# Patient Record
Sex: Male | Born: 1960 | Race: Black or African American | Hispanic: No | Marital: Married | Smoking: Never smoker
Health system: Southern US, Community
[De-identification: ages and names within clinical notes are randomized; demographics above are authoritative.]

## PROBLEM LIST (undated history)

## (undated) DIAGNOSIS — E785 Hyperlipidemia, unspecified: Secondary | ICD-10-CM

## (undated) DIAGNOSIS — I10 Essential (primary) hypertension: Secondary | ICD-10-CM

## (undated) HISTORY — DX: Hyperlipidemia, unspecified: E78.5

---

## 1998-09-10 ENCOUNTER — Encounter: Payer: Self-pay | Admitting: Emergency Medicine

## 1998-09-10 ENCOUNTER — Emergency Department (HOSPITAL_COMMUNITY): Admission: EM | Admit: 1998-09-10 | Discharge: 1998-09-11 | Payer: Self-pay | Admitting: Emergency Medicine

## 1998-10-27 ENCOUNTER — Ambulatory Visit (HOSPITAL_BASED_OUTPATIENT_CLINIC_OR_DEPARTMENT_OTHER): Admission: RE | Admit: 1998-10-27 | Discharge: 1998-10-27 | Payer: Self-pay | Admitting: *Deleted

## 2000-08-05 ENCOUNTER — Emergency Department (HOSPITAL_COMMUNITY): Admission: EM | Admit: 2000-08-05 | Discharge: 2000-08-06 | Payer: Self-pay | Admitting: Emergency Medicine

## 2000-08-06 ENCOUNTER — Encounter: Payer: Self-pay | Admitting: Emergency Medicine

## 2000-10-02 ENCOUNTER — Ambulatory Visit: Admission: RE | Admit: 2000-10-02 | Discharge: 2000-10-02 | Payer: Self-pay | Admitting: Internal Medicine

## 2000-10-02 ENCOUNTER — Encounter: Payer: Self-pay | Admitting: Internal Medicine

## 2003-12-13 ENCOUNTER — Emergency Department (HOSPITAL_COMMUNITY): Admission: EM | Admit: 2003-12-13 | Discharge: 2003-12-13 | Payer: Self-pay | Admitting: Emergency Medicine

## 2009-01-17 ENCOUNTER — Encounter: Admission: RE | Admit: 2009-01-17 | Discharge: 2009-01-17 | Payer: Self-pay | Admitting: Internal Medicine

## 2011-10-21 ENCOUNTER — Encounter (HOSPITAL_COMMUNITY): Payer: Self-pay

## 2011-10-21 ENCOUNTER — Emergency Department (HOSPITAL_COMMUNITY)
Admission: EM | Admit: 2011-10-21 | Discharge: 2011-10-22 | Disposition: A | Discharge status: Home / Self Care | Payer: BC Managed Care – PPO | Attending: Emergency Medicine | Admitting: Emergency Medicine

## 2011-10-21 DIAGNOSIS — M542 Cervicalgia: Secondary | ICD-10-CM | POA: Insufficient documentation

## 2011-10-21 DIAGNOSIS — I10 Essential (primary) hypertension: Secondary | ICD-10-CM | POA: Insufficient documentation

## 2011-10-21 DIAGNOSIS — M25519 Pain in unspecified shoulder: Secondary | ICD-10-CM

## 2011-10-21 HISTORY — DX: Essential (primary) hypertension: I10

## 2011-10-21 NOTE — ED Notes (Signed)
Pt c/o neck pain for one week. Pt seen by pmd for same. Tonight increase in pain

## 2011-10-22 ENCOUNTER — Emergency Department (HOSPITAL_COMMUNITY): Payer: BC Managed Care – PPO

## 2011-10-22 MED ORDER — HYDROCODONE-ACETAMINOPHEN 5-325 MG PO TABS: 1.0000 | ORAL_TABLET | ORAL | Status: AC | PRN

## 2011-10-22 MED ORDER — CYCLOBENZAPRINE HCL 10 MG PO TABS: 10.0000 mg | ORAL_TABLET | Freq: Two times a day (BID) | ORAL | Status: AC | PRN

## 2011-10-22 MED ORDER — OXYCODONE-ACETAMINOPHEN 5-325 MG PO TABS
1.0000 | ORAL_TABLET | Freq: Once | ORAL | Status: AC
  Administered 2011-10-22: 1 via ORAL
  Filled 2011-10-22: qty 1

## 2011-10-22 NOTE — Discharge Instructions (Signed)
Acromioclavicular Injuries  The AC (acromioclavicular) joint is the joint in the shoulder where the collarbone (clavicle) meets the shoulder blade (scapula). The part of the shoulder blade connected to the collarbone is called the acromion. Common problems with and treatments for the AC joint are detailed below.  ARTHRITIS  Arthritis occurs when the joint has been injured and the smooth padding between the joints (cartilage) is lost. This is the wear and tear seen in most joints of the body if they have been overused. This causes the joint to produce pain and swelling which is worse with activity.   AC JOINT SEPARATION  AC joint separation means that the ligaments connecting the acromion of the shoulder blade and collarbone have been damaged, and the two bones no longer line up. AC separations can be anywhere from mild to severe, and are "graded" depending upon which ligaments are torn and how badly they are torn.   Grade I Injury: the least damage is done, and the AC joint still lines up.   Grade II Injury: damage to the ligaments which reinforce the AC joint. In a Grade II injury, these ligaments are stretched but not entirely torn. When stressed, the AC joint becomes painful and unstable.   Grade III Injury: AC and secondary ligaments are completely torn, and the collarbone is no longer attached to the shoulder blade. This results in deformity; a prominence of the end of the clavicle.  AC JOINT FRACTURE  AC joint fracture means that there has been a break in the bones of the AC joint, usually the end of the clavicle.  TREATMENT  TREATMENT OF AC ARTHRITIS   There is currently no way to replace the cartilage damaged by arthritis. The best way to improve the condition is to decrease the activities which aggravate the problem. Application of ice to the joint helps decrease pain and soreness (inflammation). The use of non-steroidal anti-inflammatory medication is helpful.   If less conservative measures do not  work, then cortisone shots (injections) may be used. These are anti-inflammatories; they decrease the soreness in the joint and swelling.   If non-surgical measures fail, surgery may be recommended. The procedure is generally removal of a portion of the end of the clavicle. This is the part of the collarbone closest to your acromion which is stabilized with ligaments to the acromion of the shoulder blade. This surgery may be performed using a tube-like instrument with a light (arthroscope) for looking into a joint. It may also be performed as an open surgery through a small incision by the surgeon. Most patients will have good range of motion within 6 weeks and may return to all activity including sports by 8-12 weeks, barring complications.  TREATMENT OF AN AC SEPARATION   The initial treatment is to decrease pain. This is best accomplished by immobilizing the arm in a sling and placing an ice pack to the shoulder for 20 to 30 minutes every 2 hours as needed. As the pain starts to subside, it is important to begin moving the fingers, wrist, elbow and eventually the shoulder in order to prevent a stiff or "frozen" shoulder. Instruction on when and how much to move the shoulder will be provided by your caregiver. The length of time needed to regain full motion and function depends on the amount or grade of the injury. Recovery from a Grade I AC separation usually takes 10 to 14 days, whereas a Grade III may take 6 to 8 weeks.   Grade   III injuries usually allow return to full activity with few restrictions. Treatment is also based on the activity demands of the injured shoulder. For example, a high level quarterback with an injured throwing arm will receive more aggressive treatment than someone with a desk job who rarely uses his/her arm for strenuous activities. In some cases, a painful lump may persist which could require a later surgery.  Surgery can be very successful, but the benefits must be weighed against the potential risks.  TREATMENT OF AN AC JOINT FRACTURE Fracture treatment depends on the type of fracture. Sometimes a splint or sling may be all that is required. Other times surgery may be required for repair. This is more frequently the case when the ligaments supporting the clavicle are completely torn. Your caregiver will help you with these decisions and together you can decide what will be the best treatment. HOME CARE INSTRUCTIONS   Apply ice to the injury for 15 to 20 minutes each hour while awake for 2 days. Put the ice in a plastic bag and place a towel between the bag of ice and skin.   If a sling has been applied, wear it constantly for as long as directed by your caregiver, even at night. The sling or splint can be removed for bathing or showering or as directed. Be sure to keep the shoulder in the same place as when the sling is on. Do not lift the arm.   If a figure-of-eight splint has been applied it should be tightened gently by another person every day. Tighten it enough to keep the shoulders held back. Allow enough room to place the index finger between the body and strap. Loosen the splint immediately if there is numbness or tingling in the hands.   Take over-the-counter or prescription medicines for pain, discomfort or fever as directed by your caregiver.   If you or your child has received a follow up appointment, it is very important to keep that appointment in order to avoid long term complications, chronic pain or disability.  SEEK MEDICAL CARE IF:   The pain is not relieved with medications.   There is increased swelling or discoloration that continues to get worse rather than better.   You or your child has been unable to follow up as instructed.   There is progressive numbness and tingling in the arm, forearm or hand.  SEEK IMMEDIATE MEDICAL CARE IF:   The arm is numb, cold or pale.    There is increasing pain in the hand, forearm or fingers.  MAKE SURE YOU:   Understand these instructions.   Will watch your condition.   Will get help right away if you are not doing well or get worse.  Document Released: 04/04/2005 Document Revised: 06/14/2011 Document Reviewed: 09/27/2008 [facility] Patient Information 2012 [place], [place].Arthralgia Your caregiver has diagnosed you as suffering from an arthralgia. Arthralgia means there is pain in a joint. This can come from many reasons including:  Bruising the joint which causes soreness (inflammation) in the joint.   Wear and tear on the joints which occur as we grow older (osteoarthritis).   Overusing the joint.   Various forms of arthritis.   Infections of the joint.  Regardless of the cause of pain in your joint, most of these different pains respond to anti-inflammatory drugs and rest. The exception to this is when a joint is infected, and these cases are treated with antibiotics, if it is a bacterial infection. HOME CARE INSTRUCTIONS  Rest the injured area for as long as directed by your caregiver. Then slowly start using the joint as directed by your caregiver and as the pain allows. Crutches as directed may be useful if the ankles, knees or hips are involved. If the knee was splinted or casted, continue use and care as directed. If an stretchy or elastic wrapping bandage has been applied today, it should be removed and re-applied every 3 to 4 hours. It should not be applied tightly, but firmly enough to keep swelling down. Watch toes and feet for swelling, bluish discoloration, coldness, numbness or excessive pain. If any of these problems (symptoms) occur, remove the ace bandage and re-apply more loosely. If these symptoms persist, contact your caregiver or return to this location.   For the first 24 hours, keep the injured extremity elevated on pillows while lying down.   Apply ice for 15 to 20 minutes to the sore joint  every couple hours while awake for the first half day. Then 3 to 4 times per day for the first 48 hours. Put the ice in a plastic bag and place a towel between the bag of ice and your skin.   Wear any splinting, casting, elastic bandage applications, or slings as instructed.   Only take over-the-counter or prescription medicines for pain, discomfort, or fever as directed by your caregiver. Do not use aspirin immediately after the injury unless instructed by your physician. Aspirin can cause increased bleeding and bruising of the tissues.   If you were given crutches, continue to use them as instructed and do not resume weight bearing on the sore joint until instructed.  Persistent pain and inability to use the sore joint as directed for more than 2 to 3 days are warning signs indicating that you should see a caregiver for a follow-up visit as soon as possible. Initially, a hairline fracture (break in bone) may not be evident on X-rays. Persistent pain and swelling indicate that further evaluation, non-weight bearing or use of the joint (use of crutches or slings as instructed), or further X-rays are indicated. X-rays may sometimes not show a small fracture until a week or 10 days later. Make a follow-up appointment with your own caregiver or one to whom we have referred you. A radiologist (specialist in reading X-rays) may read your X-rays. Make sure you know how you are to obtain your X-ray results. Do not assume everything is normal if you do not hear from [place]. SEEK MEDICAL CARE IF: Bruising, swelling, or pain increases. SEEK IMMEDIATE MEDICAL CARE IF:   Your fingers or toes are numb or blue.   The pain is not responding to medications and continues to stay the same or get worse.   The pain in your joint becomes severe.   You develop a fever over 102 F (38.9 C).   It becomes impossible to move or use the joint.  MAKE SURE YOU:   Understand these instructions.   Will watch your condition.     Will get help right away if you are not doing well or get worse.  Document Released: 06/25/2005 Document Revised: 06/14/2011 Document Reviewed: 02/11/2008 [facility] Patient Information 2012 [place], [place].Shoulder Pain The shoulder is a ball and socket joint. The muscles and tendons (rotator cuff) are what keep the shoulder in its joint and stable. This collection of muscles and tendons holds in the head (ball) of the humerus (upper arm bone) in the fossa (cup) of the scapula (shoulder blade). Today no reason  was found for your shoulder pain. Often pain in the shoulder may be treated conservatively with temporary immobilization. For example, holding the shoulder in one place using a sling for rest. Physical therapy may be needed if problems continue. HOME CARE INSTRUCTIONS   Apply ice to the sore area for 15 to 20 minutes, 3 to 4 times per day for the first 2 days. Put the ice in a plastic bag. Place a towel between the bag of ice and your skin.   If you have or were given a shoulder sling and straps, do not remove for as long as directed by your caregiver or until you see a caregiver for a follow-up examination. If you need to remove it to shower or bathe, move your arm as little as possible.   Sleep on several pillows at night to lessen swelling and pain.   Only take over-the-counter or prescription medicines for pain, discomfort, or fever as directed by your caregiver.   Keep any follow-up appointments in order to avoid any type of permanent shoulder disability or chronic pain problems.  SEEK MEDICAL CARE IF:   Pain in your shoulder increases or new pain develops in your arm, hand, or fingers.   Your hand or fingers are colder than your other hand.   You do not obtain pain relief with the medications or your pain becomes worse.  SEEK IMMEDIATE MEDICAL CARE IF:   Your arm, hand, or fingers are numb or tingling.   Your arm, hand, or fingers are swollen, painful, or turn white or blue.    You develop chest pain or shortness of breath.  MAKE SURE YOU:   Understand these instructions.   Will watch your condition.   Will get help right away if you are not doing well or get worse.  Document Released: 04/04/2005 Document Revised: 06/14/2011 Document Reviewed: 06/09/2011 [facility] Patient Information 2012 [place], [place].

## 2011-10-22 NOTE — ED Provider Notes (Signed)
History     CSN: [id number]  Arrival date & time 10/21/11  2244   First MD Initiated Contact with Patient 10/21/11 2343      Chief Complaint  Patient presents with  . Neck Pain    (Consider location/radiation/quality/duration/timing/severity/associated sxs/prior treatment) HPI  Pt presents to the ED with 1 week of left shoulder pain. He points to hi acromioclavicular joint. Th patient has not been seen yet, he says for this reason. He states that he is not having any neck pain or chest pain. Not shortness of breath, weakness, wheezing or recent illness. 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 Pt is alert and oriented, acting appropriately.   Past Medical History  Diagnosis Date  . Hypertension     No past surgical history on file.  History reviewed. No pertinent family history.  History  Substance Use Topics  . Smoking status: Never Smoker   . Smokeless tobacco: Not on file  . Alcohol Use: No      Review of Systems  All other systems reviewed and are negative.    Allergies  Review of patient's allergies indicates no known allergies.  Home Medications   Current Outpatient Rx  Name Route Sig Dispense Refill  . CYCLOBENZAPRINE HCL 10 MG PO TABS Oral Take 1 tablet (10 mg total) by mouth 2 (two) times daily as needed for muscle spasms. 20 tablet 0  . HYDROCODONE-ACETAMINOPHEN 5-325 MG PO TABS Oral Take 1 tablet by mouth every 4 (four) hours as needed for pain. 10 tablet 0    BP 122/75  Pulse 77  Temp(Src) 98.7 F (37.1 C) (Oral)  Resp 18  Ht 5\' 6"  (1.676 m)  Wt 190 lb (86.183 kg)  BMI 30.67 kg/m2  SpO2 98%  Physical Exam  Nursing note and vitals reviewed. Constitutional: He is oriented to person, place, and time. He appears well-developed and well-nourished. No distress.  HENT:  Head: Normocephalic and atraumatic.  Eyes: Pupils are equal, round, and reactive to light.  Neck: Normal range of motion. Neck supple.    Cardiovascular: Normal rate and regular rhythm.   Pulmonary/Chest: Effort normal.  Abdominal: Soft.  Musculoskeletal:       Left shoulder: He exhibits tenderness (pt very tender at [facility] joint), bony tenderness and pain. He exhibits normal range of motion, no swelling, no effusion, no crepitus, no deformity, no laceration, no spasm, normal pulse and normal strength.       Arms: Neurological: He is oriented to person, place, and time.  Skin: Skin is warm and dry.    ED Course  Procedures (including critical care time)  Labs Reviewed - No data to display Dg Shoulder Left  10/22/2011  *RADIOLOGY REPORT*  Clinical Data: Neck and anterior left shoulder pain.  LEFT SHOULDER - 2+ VIEW  Comparison: None.  Findings: There is no evidence of fracture or dislocation.  The left humeral head is seated within the glenoid fossa.  The acromioclavicular joint is unremarkable in appearance.  No significant soft tissue abnormalities are seen.  The left lung appears clear.  IMPRESSION: No evidence of fracture or dislocation.  Original Report Authenticated By: Tonia Ghent, M.D.     1. Shoulder pain       MDM  Pt given pain medication in ED and an Rx for the same. PT given referral to Ortho for further work-up and management He says he can not take time off from work and declines work  note.  Pt has been advised of the symptoms that warrant their return to the ED. Patient has voiced understanding and has agreed to follow-up with the PCP or specialist.         Dorthula Matas, PA 10/22/11 [phone]

## 2011-10-22 NOTE — ED Notes (Signed)
Ambulates out with mother

## 2011-10-22 NOTE — ED Provider Notes (Signed)
Medical screening examination/treatment/procedure(s) were performed by non-physician practitioner and as supervising physician I was immediately available for consultation/collaboration.  Amari Burnsworth M Melida Northington, MD 10/22/11 0712 

## 2012-01-13 ENCOUNTER — Emergency Department (HOSPITAL_COMMUNITY)
Admission: EM | Admit: 2012-01-13 | Discharge: 2012-01-13 | Disposition: A | Discharge status: Home / Self Care | Payer: BC Managed Care – PPO | Attending: Emergency Medicine | Admitting: Emergency Medicine

## 2012-01-13 ENCOUNTER — Encounter (HOSPITAL_COMMUNITY): Payer: Self-pay | Admitting: Nurse Practitioner

## 2012-01-13 DIAGNOSIS — H11419 Vascular abnormalities of conjunctiva, unspecified eye: Secondary | ICD-10-CM | POA: Insufficient documentation

## 2012-01-13 DIAGNOSIS — I10 Essential (primary) hypertension: Secondary | ICD-10-CM | POA: Insufficient documentation

## 2012-01-13 DIAGNOSIS — H109 Unspecified conjunctivitis: Secondary | ICD-10-CM | POA: Insufficient documentation

## 2012-01-13 DIAGNOSIS — H5789 Other specified disorders of eye and adnexa: Secondary | ICD-10-CM | POA: Insufficient documentation

## 2012-01-13 MED ORDER — SULFACETAMIDE SODIUM 10 % OP SOLN: 2.0000 [drp] | OPHTHALMIC | Status: AC

## 2012-01-13 MED ORDER — NAPHAZOLINE-PHENIRAMINE 0.025-0.3 % OP SOLN: 1.0000 [drp] | OPHTHALMIC | Status: AC | PRN

## 2012-01-13 NOTE — ED Provider Notes (Signed)
History   This chart was scribed for Douglas Booze, MD scribed by Magnus Sinning. The patient was seen in room TR02C/TR02C seen at 18:33.    CSN: [id number]  Arrival date & time 01/13/12  1617   None     Chief Complaint  Patient presents with  . Eye Problem    (Consider location/radiation/quality/duration/timing/severity/associated sxs/prior treatment) HPI Douglas Wolf is a 51 y.o. male who presents to the Emergency Department complaining of moderate left eye redness, that he states he noticed this morning when he woke up. Denies exposure to anyone with conjunctivitis, photophobia, eye drainage, watery eyes, visual changes, blurry vision, or irritation. Patient states that he is unsure if he had any foreign bodies in eye and states he does not have any current foreign body sensation in either eye. Patient does not smoke or consumer ETOH.  Past Medical History  Diagnosis Date  . Hypertension     History reviewed. No pertinent past surgical history.  History reviewed. No pertinent family history.  History  Substance Use Topics  . Smoking status: Never Smoker   . Smokeless tobacco: Not on file  . Alcohol Use: No      Review of Systems  Eyes: Positive for redness. Negative for photophobia, pain, discharge, itching and visual disturbance.    Allergies  Review of patient's allergies indicates no known allergies.  Home Medications  No current outpatient prescriptions on file.  BP 121/74  Pulse 84  Temp 98.4 F (36.9 C) (Oral)  Resp 18  SpO2 100%  Physical Exam  Nursing note and vitals reviewed. Constitutional: He is oriented to person, place, and time. He appears well-developed and well-nourished. No distress.  HENT:  Head: Normocephalic and atraumatic.  Eyes: EOM are normal.       Mild conjunctiva injection, left worse than right. No photophobia and anterior chambers are clear.   Neck: Neck supple. No tracheal deviation present.  Cardiovascular: Normal rate.     Pulmonary/Chest: Effort normal. No respiratory distress.  Musculoskeletal: Normal range of motion.  Neurological: He is alert and oriented to person, place, and time.  Skin: Skin is warm and dry.  Psychiatric: He has a normal mood and affect. His behavior is normal.    ED Course  Procedures (including critical care time) DIAGNOSTIC STUDIES: Oxygen Saturation is 100% on room air, normal by my interpretation.    COORDINATION OF CARE:   1. Conjunctivitis of both eyes       MDM  Conjunctivitis without evidence of other eye pathology. He is given a prescription for sulfacetamide solution and advised to followup with the ophthalmologist if not improving in the next several days.  I personally performed the services described in this documentation, which was scribed in my presence. The recorded information has been reviewed and considered.            Douglas Booze, MD 01/17/12 [phone]

## 2012-01-13 NOTE — ED Notes (Signed)
Woke this am with redness to L eye. Denies injury. Denies pain, itching or other complaints

## 2013-05-18 ENCOUNTER — Other Ambulatory Visit: Payer: Self-pay | Admitting: Internal Medicine

## 2013-05-18 ENCOUNTER — Ambulatory Visit
Admission: RE | Admit: 2013-05-18 | Discharge: 2013-05-18 | Disposition: A | Discharge status: Home / Self Care | Payer: BC Managed Care – PPO | Source: Ambulatory Visit | Attending: Internal Medicine | Admitting: Internal Medicine

## 2013-05-18 DIAGNOSIS — R609 Edema, unspecified: Secondary | ICD-10-CM

## 2013-05-18 DIAGNOSIS — R52 Pain, unspecified: Secondary | ICD-10-CM

## 2013-06-01 ENCOUNTER — Encounter: Payer: Self-pay | Admitting: Podiatry

## 2013-06-01 ENCOUNTER — Ambulatory Visit (INDEPENDENT_AMBULATORY_CARE_PROVIDER_SITE_OTHER): Payer: BC Managed Care – PPO | Admitting: Podiatry

## 2013-06-01 ENCOUNTER — Ambulatory Visit (INDEPENDENT_AMBULATORY_CARE_PROVIDER_SITE_OTHER): Payer: BC Managed Care – PPO

## 2013-06-01 VITALS — BP 126/76 | HR 77 | Resp 16

## 2013-06-01 DIAGNOSIS — M79609 Pain in unspecified limb: Secondary | ICD-10-CM

## 2013-06-01 DIAGNOSIS — M79672 Pain in left foot: Secondary | ICD-10-CM

## 2013-06-01 DIAGNOSIS — M722 Plantar fascial fibromatosis: Secondary | ICD-10-CM

## 2013-06-01 MED ORDER — DICLOFENAC SODIUM 75 MG PO TBEC: 75.0000 mg | DELAYED_RELEASE_TABLET | Freq: Two times a day (BID) | ORAL | Status: DC

## 2013-06-01 MED ORDER — TRIAMCINOLONE ACETONIDE 10 MG/ML IJ SUSP
10.0000 mg | Freq: Once | INTRAMUSCULAR | Status: AC
  Administered 2013-06-01: 10 mg

## 2013-06-03 NOTE — Progress Notes (Signed)
Subjective:     Patient ID: Douglas Wolf, male   DOB: Jul 07, 1961, 52 y.o.   MRN: [id number]  HPI patient presents stating my left heel and arch have been bothering me a lot and I'm not sure what might be happening with it. States that his family doctor did an x-ray and did not see anything but he is scared that there might be a broken bone   Review of Systems     Objective:   Physical Exam Neurovascular status found to be intact with no health history changes noted. Muscle strength adequate mild equinus condition was noted flatfoot deformity noted of both feet.   upon evaluation I noted edema in the plantar aspect left heel and arch with an area that is quite inflamed and sore when pressed Assessment:     Acute plantar fasciitis left with inflammation and fluid noted just distal to the insertion into the calcaneus    Plan:     X-ray and H&P reviewed with patient. Injected the plantar fascia 3 mg Kenalog 5 of Xylocaine Marcaine mixture and dispensed fascial brace with instructions on usage and placed on oral anti-inflammatory Voltaren 75 mg twice a day. Reappoint her recheck in one week

## 2013-06-08 ENCOUNTER — Ambulatory Visit (INDEPENDENT_AMBULATORY_CARE_PROVIDER_SITE_OTHER): Payer: BC Managed Care – PPO | Admitting: Podiatry

## 2013-06-08 ENCOUNTER — Encounter: Payer: Self-pay | Admitting: Podiatry

## 2013-06-08 VITALS — BP 132/77 | HR 72 | Resp 12

## 2013-06-08 DIAGNOSIS — M722 Plantar fascial fibromatosis: Secondary | ICD-10-CM

## 2013-06-10 NOTE — Progress Notes (Signed)
Subjective:     Patient ID: Douglas Wolf, male   DOB: August 06, 1960, 52 y.o.   MRN: [id number]  HPI patient states my left heel is feeling much better but my foot is so flat I know I need some kind inserts to hold the as this has been a chronic condition   Review of Systems     Objective:   Physical Exam    neurovascular status intact with collapse medial longitudinal arch noted both feet and also moderate discomfort to pressure medial fascially band distal to the insertion into the calcaneus Assessment:     Plantar fasciitis left still present with structural changes noted    Plan:     I explained physical therapy and supportive shoe gear and continuation of voltaren. I scanned for custom orthotics to reduce stress on the heel and to lift up the medial longitudinal arch

## 2013-07-06 ENCOUNTER — Encounter: Payer: Self-pay | Admitting: Podiatry

## 2013-07-06 ENCOUNTER — Ambulatory Visit (INDEPENDENT_AMBULATORY_CARE_PROVIDER_SITE_OTHER): Payer: BC Managed Care – PPO | Admitting: Podiatry

## 2013-07-06 VITALS — BP 113/64 | HR 92 | Resp 12

## 2013-07-06 DIAGNOSIS — M7750 Other enthesopathy of unspecified foot: Secondary | ICD-10-CM

## 2013-07-06 NOTE — Patient Instructions (Signed)

## 2013-07-06 NOTE — Progress Notes (Signed)
Subjective:     Patient ID: Douglas Wolf, male   DOB: 1960/11/12, 52 y.o.   MRN: [id number]  HPI patient presents to pick up orthotic stating his heel and arch are feeling better at the current time   Review of Systems     Objective:   Physical Exam Neurovascular status unchanged no health history changes noted with diminishment of pain in the plantar fascial band both feet    Assessment:     Plantar fasciitis improving at this time    Plan:     Gave instructions on physical therapy anti-inflammatories and dispensed orthotics with all instructions on usage

## 2013-08-24 ENCOUNTER — Encounter: Payer: Self-pay | Admitting: Podiatry

## 2013-08-24 ENCOUNTER — Ambulatory Visit (INDEPENDENT_AMBULATORY_CARE_PROVIDER_SITE_OTHER): Payer: BC Managed Care – PPO | Admitting: Podiatry

## 2013-08-24 VITALS — BP 120/72 | HR 59 | Resp 16

## 2013-08-24 DIAGNOSIS — M7750 Other enthesopathy of unspecified foot: Secondary | ICD-10-CM

## 2013-08-24 DIAGNOSIS — B351 Tinea unguium: Secondary | ICD-10-CM

## 2013-08-25 NOTE — Progress Notes (Signed)
Subjective:     Patient ID: Douglas Wolf, male   DOB: 12/08/1960, 53 y.o.   MRN: [id number]  HPI patient presents stating his foot is feeling quite a bit better but having some thickness of his nailbeds which can become bothersome   Review of Systems     Objective:   Physical Exam Neurovascular status unchanged with thick nailbeds 1-5 both feet   and improved pain plantar right arch Assessment:     Mycotic nail infection and improved plantar fasciitis    Plan:     Reviewed condition and discussed trimming to without at this time consideration for oral medications due to the type of fungus patient has reappoint if it gets worse

## 2015-03-03 ENCOUNTER — Encounter: Payer: Self-pay | Admitting: Podiatry

## 2015-03-03 ENCOUNTER — Ambulatory Visit (INDEPENDENT_AMBULATORY_CARE_PROVIDER_SITE_OTHER): Payer: Managed Care, Other (non HMO) | Admitting: Podiatry

## 2015-03-03 ENCOUNTER — Ambulatory Visit (INDEPENDENT_AMBULATORY_CARE_PROVIDER_SITE_OTHER): Payer: Managed Care, Other (non HMO)

## 2015-03-03 VITALS — BP 117/71 | HR 71 | Resp 16

## 2015-03-03 DIAGNOSIS — M779 Enthesopathy, unspecified: Secondary | ICD-10-CM | POA: Diagnosis not present

## 2015-03-03 DIAGNOSIS — B351 Tinea unguium: Secondary | ICD-10-CM | POA: Diagnosis not present

## 2015-03-03 DIAGNOSIS — M79673 Pain in unspecified foot: Secondary | ICD-10-CM | POA: Diagnosis not present

## 2015-03-04 NOTE — Progress Notes (Signed)
He presents today for a follow-up routine nail debridement. He states that his toenails are long and painful. He also states that he has pain to the dorsal lateral aspect of his left foot.   Objective: vital signs are stable alert and oriented 3. Pulses are strongly palpable. Nails are thick yellow dystrophic onychomycotic. He has pain on palpation dorsal aspect left foot. Lisfranc's joints.  Assessment: Capsulitis dorsal aspect left foot.  Pain in limb secondary to onychomycosis.  Plan: injected the area today with dexamethasone local and aesthetic debrided nails 1 through 5 and 6 through 10.   Arbutus Ped DPM

## 2015-03-21 ENCOUNTER — Ambulatory Visit (INDEPENDENT_AMBULATORY_CARE_PROVIDER_SITE_OTHER): Payer: Managed Care, Other (non HMO) | Admitting: Podiatry

## 2015-03-21 ENCOUNTER — Encounter: Payer: Self-pay | Admitting: Podiatry

## 2015-03-21 VITALS — BP 113/63 | HR 60 | Resp 16

## 2015-03-21 DIAGNOSIS — M7750 Other enthesopathy of unspecified foot: Secondary | ICD-10-CM

## 2015-03-21 DIAGNOSIS — M6588 Other synovitis and tenosynovitis, other site: Secondary | ICD-10-CM

## 2015-03-21 DIAGNOSIS — M79672 Pain in left foot: Secondary | ICD-10-CM | POA: Diagnosis not present

## 2015-03-21 MED ORDER — FENOPROFEN CALCIUM 200 MG PO CAPS: 200.0000 mg | ORAL_CAPSULE | Freq: Three times a day (TID) | ORAL | Status: DC

## 2015-03-21 MED ORDER — TRIAMCINOLONE ACETONIDE 10 MG/ML IJ SUSP
10.0000 mg | Freq: Once | INTRAMUSCULAR | Status: AC
  Administered 2015-03-21: 10 mg

## 2015-03-21 NOTE — Progress Notes (Signed)
Subjective:     Patient ID: Douglas Wolf, male   DOB: 1961-04-09, 54 y.o.   MRN: [id number]  HPI patient states he is still experiencing pain in the dorsum of the left foot between the anterior tibial tendon and extensor hallucis longus tendon. States that his foot is flat and that when he stands a lot it gets quite sore   Review of Systems     Objective:   Physical Exam Neurovascular status intact muscle strength adequate with continued discomfort in the dorsum of the left foot around the extensor tendon complex and anterior tibial tendon    Assessment:     Dorsal tendinitis left with inflammation    Plan:     Advised on physical therapy and reinjected the area carefully with 3 mg Kenalog 5 mg Xylocaine and applied fascial brace to lift the arch up. Reappoint for [place] to recheck again and may require orthotics

## 2015-03-22 ENCOUNTER — Telehealth: Payer: Self-pay | Admitting: *Deleted

## 2015-03-22 NOTE — Telephone Encounter (Addendum)
Received fax from [facility] [address] stating Nalfon  is not longer available.  Unable to leave a message, the phone line clicked and no one answered.  Dr. Charlsie Merles ordered Nalfon  #80 one capsule bid 0 refills, sent to [facility].  Ordered and contacted pt's home phone and informed pt' mtr.

## 2015-03-23 MED ORDER — FENOPROFEN CALCIUM 400 MG PO CAPS: 1.0000 | ORAL_CAPSULE | Freq: Two times a day (BID) | ORAL | Status: AC

## 2015-04-18 ENCOUNTER — Ambulatory Visit: Payer: Managed Care, Other (non HMO) | Admitting: Podiatry

## 2015-04-24 IMAGING — CR DG FOOT COMPLETE 3+V*L*
3 series · 3 of 3 positions shown · non-contrast
Comparison: None.

CLINICAL DATA: Pain and swelling

EXAM:
LEFT FOOT - COMPLETE 3+ VIEW

[view not recorded (1 of 3)]
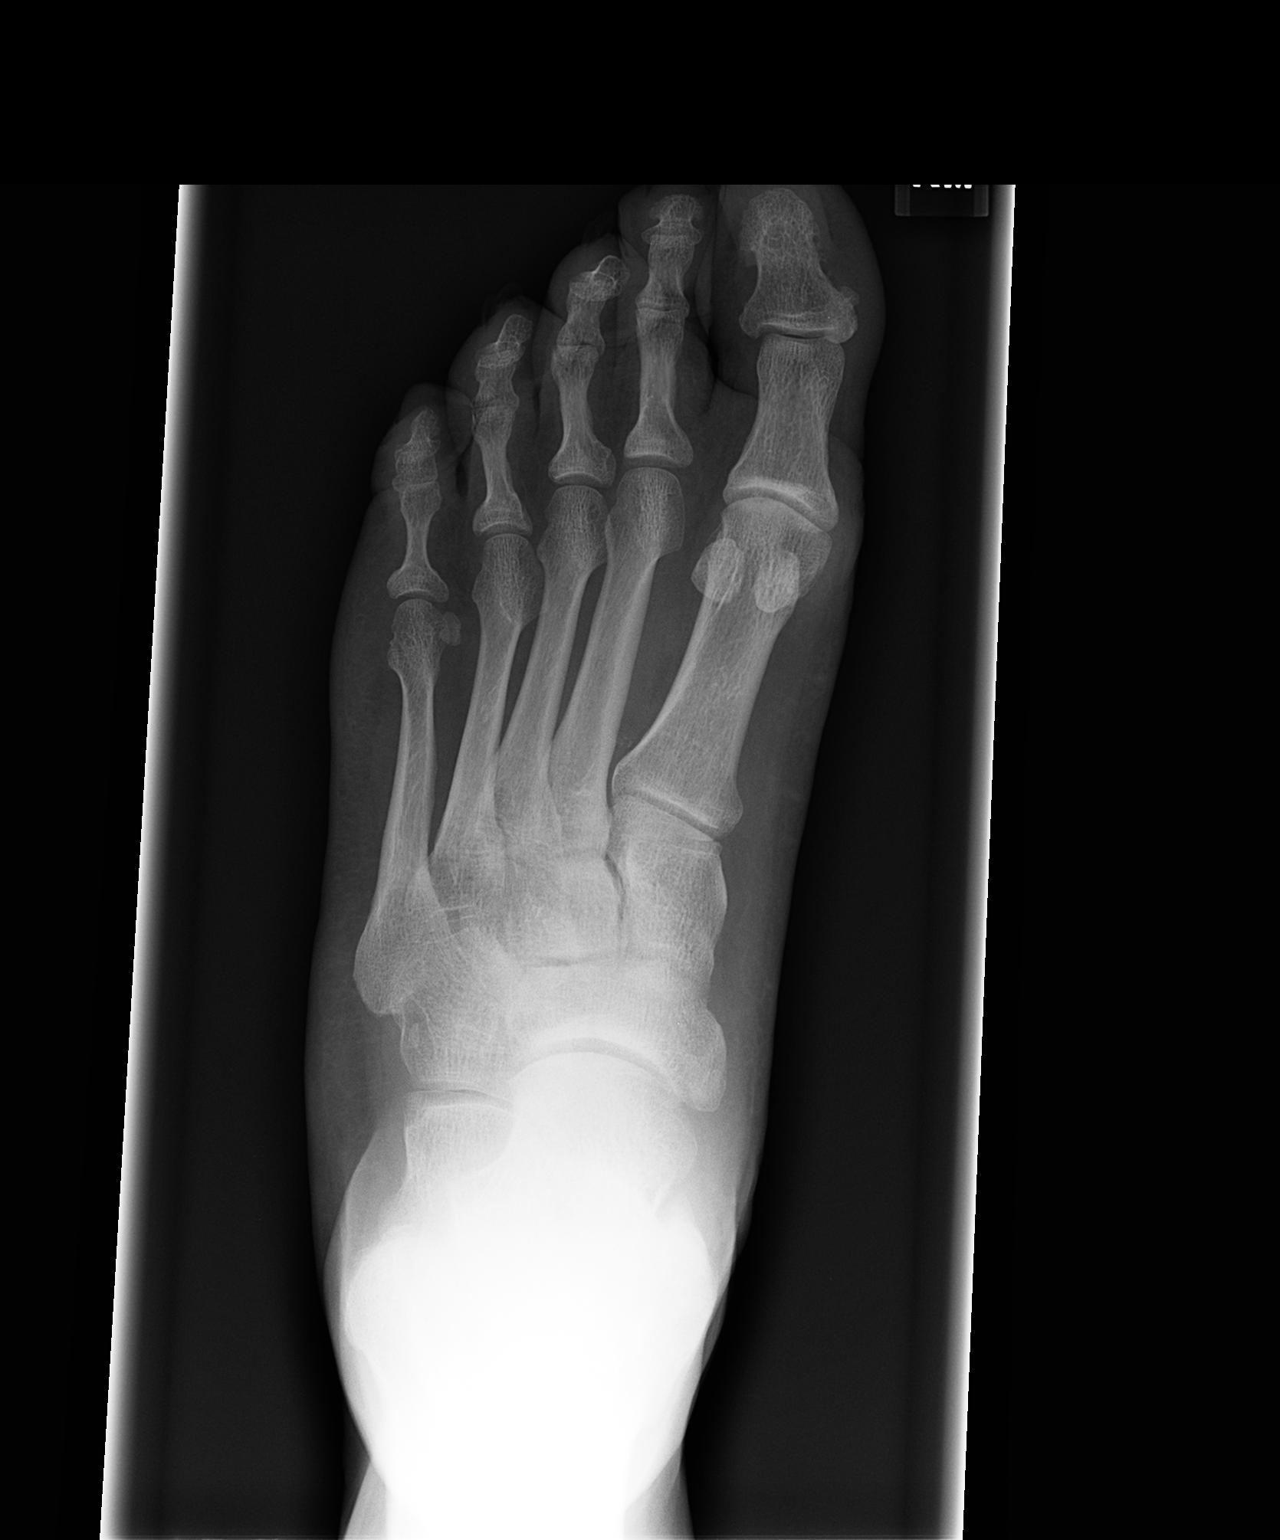

[view not recorded (2 of 3)]
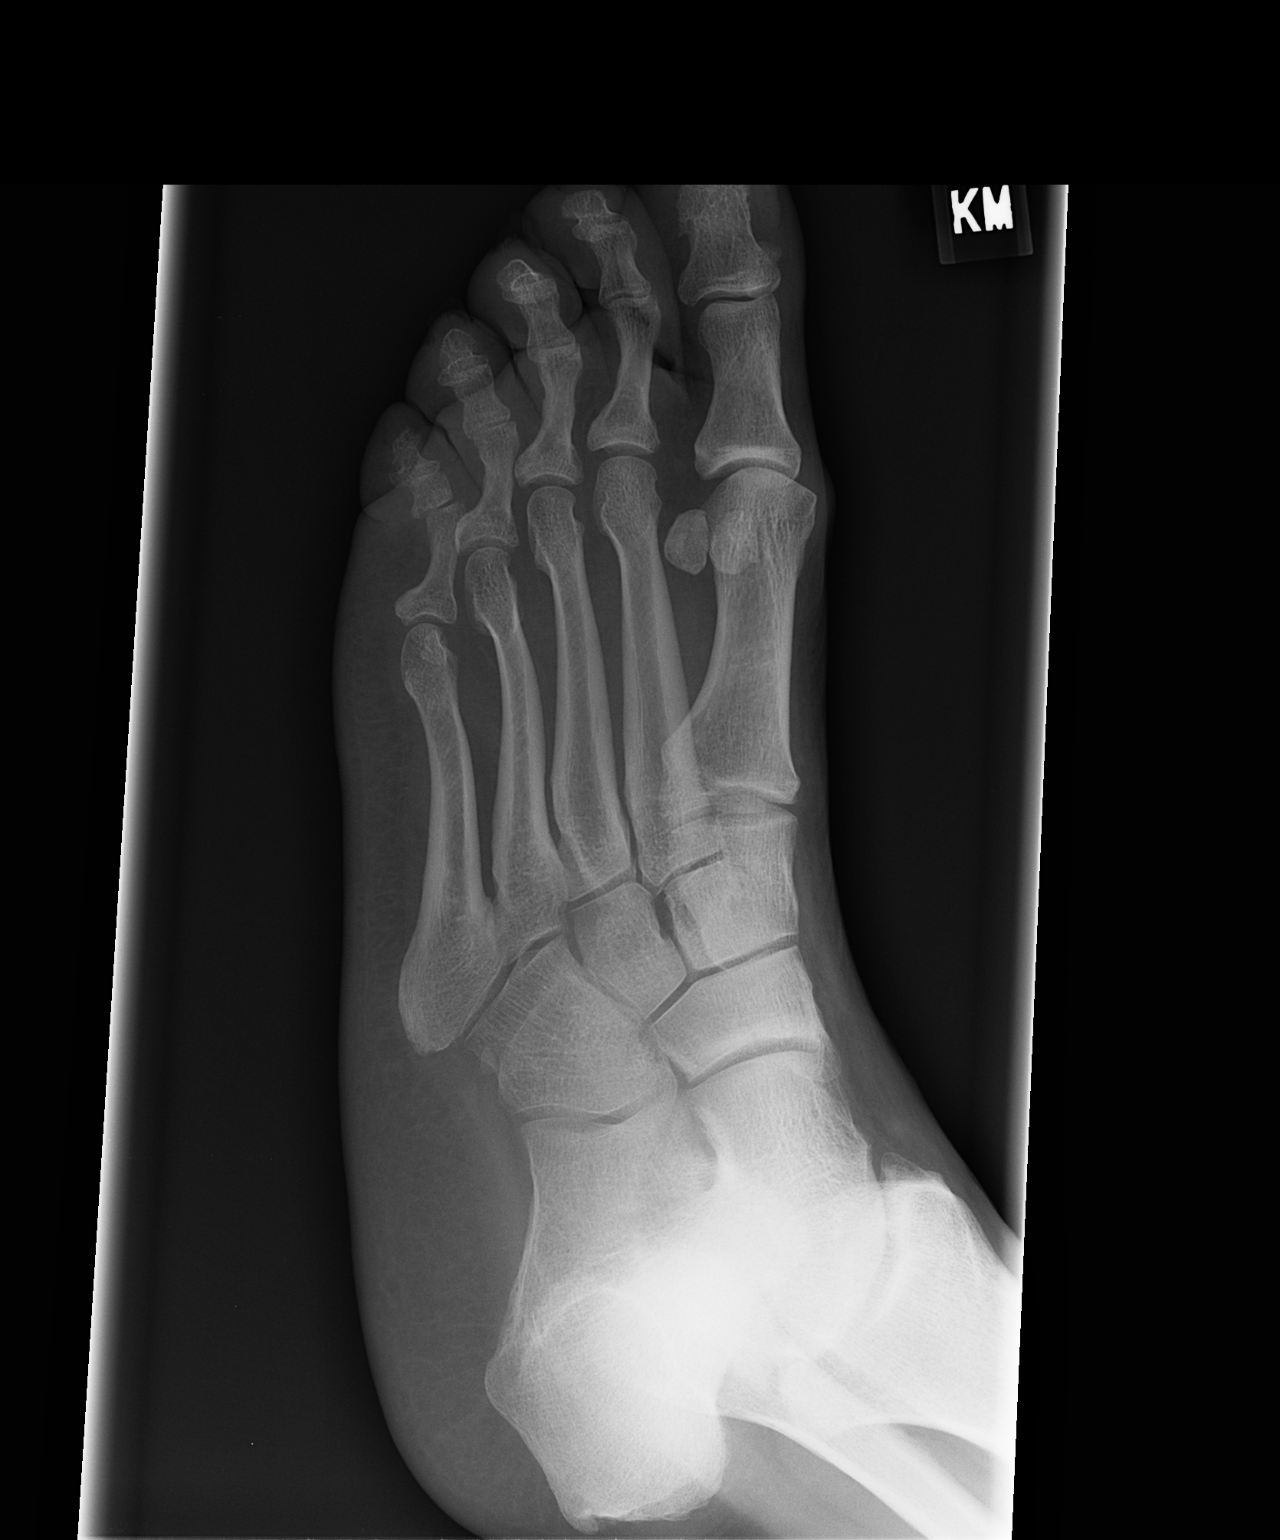

[view not recorded (3 of 3)]
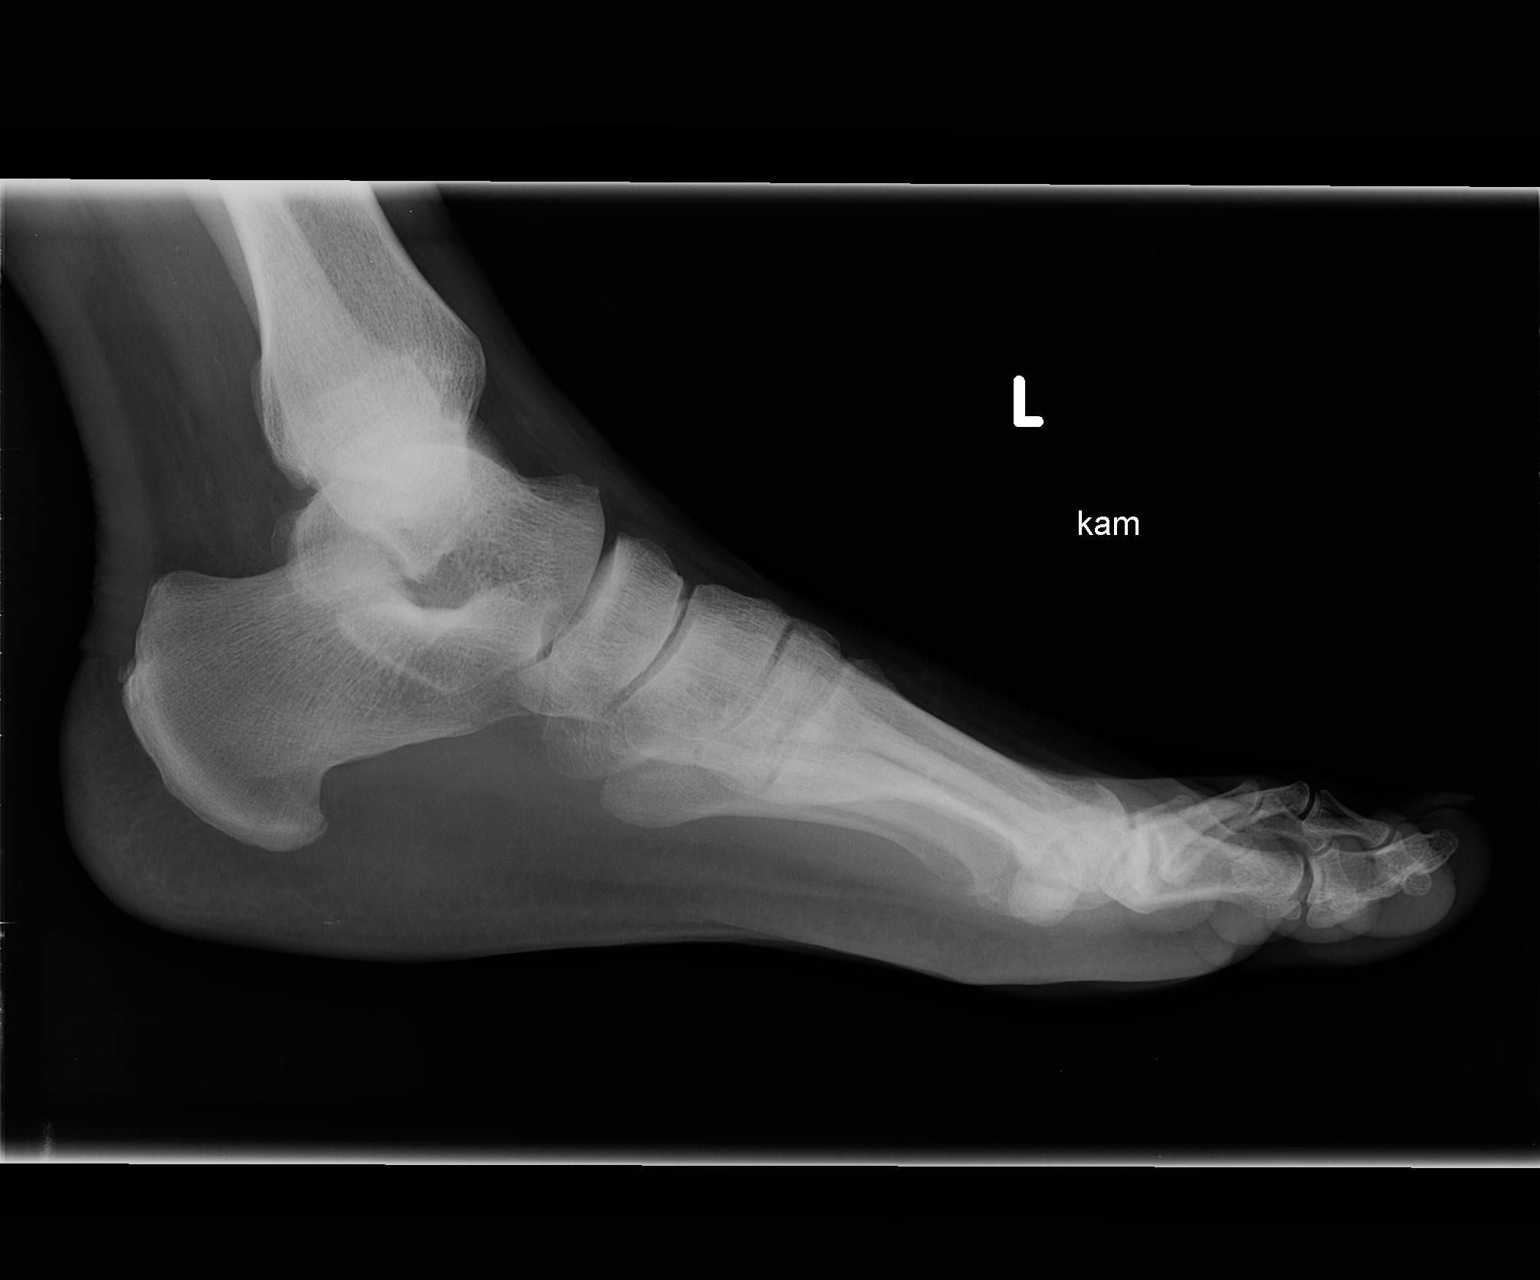

[3 of 3 positions shown; findings below may reference images not displayed]

FINDINGS: There is no evidence of fracture or dislocation. There is no
evidence of arthropathy or other focal bone abnormality. Soft
tissues are unremarkable.
IMPRESSION: Negative.

## 2015-09-25 ENCOUNTER — Encounter (HOSPITAL_COMMUNITY): Payer: Self-pay | Admitting: Emergency Medicine

## 2015-09-25 ENCOUNTER — Emergency Department (HOSPITAL_COMMUNITY)
Admission: EM | Admit: 2015-09-25 | Discharge: 2015-09-25 | Disposition: A | Discharge status: Home / Self Care | Payer: Managed Care, Other (non HMO) | Source: Home / Self Care | Attending: Emergency Medicine | Admitting: Emergency Medicine

## 2015-09-25 DIAGNOSIS — M722 Plantar fascial fibromatosis: Secondary | ICD-10-CM

## 2015-09-25 MED ORDER — DICLOFENAC SODIUM 50 MG PO TBEC: 50.0000 mg | DELAYED_RELEASE_TABLET | Freq: Three times a day (TID) | ORAL | Status: AC

## 2015-09-25 NOTE — ED Notes (Signed)
C/o intermittent right foot/heel pain onset x3 months... Pain increases w/activity... Denies inj/trauma Steady gait... A&O x4... No acute distress.

## 2015-09-25 NOTE — ED Provider Notes (Signed)
CSN: [id number]     Arrival date & time 09/25/15  1353 History   First MD Initiated Contact with Patient 09/25/15 1515     Chief Complaint  Patient presents with  . Foot Pain   (Consider location/radiation/quality/duration/timing/severity/associated sxs/prior Treatment) HPI  He is a 55 year old man here for evaluation of right foot pain.  Pain is located on the plantar aspect of his foot over the heel. It is worse with walking, particularly first thing in the morning. He denies any injury or trauma. He states this is been going on for about 3 weeks. He says it feels similar to when he had plantar fasciitis in his left foot.  Past Medical History  Diagnosis Date  . Hypertension   . Hyperlipidemia    History reviewed. No pertinent past surgical history. No family history on file. Social History  Substance Use Topics  . Smoking status: Never Smoker   . Smokeless tobacco: None  . Alcohol Use: No    Review of Systems As in history of present illness Allergies  Review of patient's allergies indicates no known allergies.  Home Medications   Prior to Admission medications   Medication Sig Start Date End Date Taking? Authorizing Provider  amLODipine-benazepril (LOTREL) 10-20 MG per capsule Take 1 capsule by mouth daily.   Yes Historical Provider, MD  hydrochlorothiazide (HYDRODIURIL) 25 MG tablet Take 25 mg by mouth daily.   Yes Historical Provider, MD  metoprolol tartrate (LOPRESSOR) 25 MG tablet Take 25 mg by mouth 2 (two) times daily.   Yes Historical Provider, MD  rosuvastatin (CRESTOR) 40 MG tablet Take 40 mg by mouth daily.   Yes Historical Provider, MD  diclofenac (VOLTAREN) 50 MG EC tablet Take 1 tablet (50 mg total) by mouth 3 (three) times daily. For 5 days, then as needed for pain 09/25/15   Charm RingsErin J Kelon Easom, MD  Fenoprofen Calcium 400 MG CAPS Take 1 capsule (400 mg total) by mouth 2 (two) times daily. 03/23/15   Lenn SinkNorman S Regal, DPM   Meds Ordered and Administered this Visit   Medications - No data to display  BP 118/68 mmHg  Pulse 63  Temp(Src) 98 F (36.7 C) (Oral)  Resp 16  SpO2 99% No data found.   Physical Exam  Constitutional: He is oriented to person, place, and time. He appears well-developed and well-nourished. No distress.  Cardiovascular: Normal rate.   Pulmonary/Chest: Effort normal.  Musculoskeletal:  Right foot: No erythema or edema. 2+ DP pulse. He is tender at the site of the plantar fascia insertion. 5 out of 5 strength in plantar flexion and dorsiflexion.  Neurological: He is alert and oriented to person, place, and time.    ED Course  Procedures (including critical care time)  Labs Review Labs Reviewed - No data to display  Imaging Review No results found.    MDM   1. Plantar fasciitis of right foot    We'll treat with ice massage, diclofenac, and heel cup. If this is not improving in the next 2 weeks, he will follow-up with his foot doctor.    Charm RingsErin J Aalina Brege, MD 09/25/15 1534

## 2015-09-25 NOTE — Discharge Instructions (Signed)
You have plantar fasciitis in your foot. Freeze a bottle of water or a golf ball. Massage your foot with this at least 3 times a day. Take diclofenac 3 times a day for the next 5 days, then as needed for pain. Get a heel cup at the store to wear in your shoe. Improving in the next 2 weeks, please follow-up with your foot doctor.

## 2015-10-10 ENCOUNTER — Ambulatory Visit (INDEPENDENT_AMBULATORY_CARE_PROVIDER_SITE_OTHER): Payer: Managed Care, Other (non HMO)

## 2015-10-10 ENCOUNTER — Ambulatory Visit (INDEPENDENT_AMBULATORY_CARE_PROVIDER_SITE_OTHER): Payer: Managed Care, Other (non HMO) | Admitting: Podiatry

## 2015-10-10 ENCOUNTER — Encounter: Payer: Self-pay | Admitting: Podiatry

## 2015-10-10 VITALS — BP 109/68 | HR 68 | Resp 16

## 2015-10-10 DIAGNOSIS — M79671 Pain in right foot: Secondary | ICD-10-CM

## 2015-10-10 DIAGNOSIS — B351 Tinea unguium: Secondary | ICD-10-CM

## 2015-10-10 DIAGNOSIS — M79673 Pain in unspecified foot: Secondary | ICD-10-CM | POA: Diagnosis not present

## 2015-10-10 DIAGNOSIS — M25474 Effusion, right foot: Secondary | ICD-10-CM | POA: Diagnosis not present

## 2015-10-10 DIAGNOSIS — M722 Plantar fascial fibromatosis: Secondary | ICD-10-CM

## 2015-10-10 MED ORDER — TRIAMCINOLONE ACETONIDE 10 MG/ML IJ SUSP
10.0000 mg | Freq: Once | INTRAMUSCULAR | Status: AC
  Administered 2015-10-10: 10 mg

## 2015-10-10 NOTE — Patient Instructions (Signed)

## 2015-10-11 NOTE — Progress Notes (Signed)
Subjective:     Patient ID: Douglas Wolf, male   DOB: 11/16/1960, 55 y.o.   MRN: [id number]  HPI patient presents with painful right heel insertional point tendon into the calcaneus and thick yellow brittle nailbeds 1-5 both feet that can be bothersome at times   Review of Systems  All other systems reviewed and are negative.      Objective:   Physical Exam  Constitutional: He is oriented to person, place, and time.  Cardiovascular: Intact distal pulses.   Musculoskeletal: Normal range of motion.  Neurological: He is oriented to person, place, and time.  Skin: Skin is warm.  Nursing note and vitals reviewed.  neurovascular status intact muscle strength adequate range of motion within normal limits with patient found to have exquisite discomfort right plantar heel insertional point tendon calcaneus and thick yellow brittle nailbeds 1-5 both feet that are impossible for him to cut and are painful     Assessment:     Acute plantar fasciitis right with moderate depression of the arch and mycotic infection nailbeds 1-5 both feet with pain    Plan:     Both conditions discussed and today I went ahead and injected the right plantar fascia 3 Milligan Kenalog 5 mill grams Xylocaine after reviewing x-rays and placed in fascially brace. Debrided nailbeds 1-5 both feet and smoothed with no iatrogenic bleeding noted  X-rays indicated small spur with no indications of stress fracture with moderate depression of the arch noted

## 2015-10-24 ENCOUNTER — Ambulatory Visit: Payer: Managed Care, Other (non HMO) | Admitting: Podiatry

## 2015-10-24 ENCOUNTER — Ambulatory Visit (INDEPENDENT_AMBULATORY_CARE_PROVIDER_SITE_OTHER): Payer: Managed Care, Other (non HMO) | Admitting: Podiatry

## 2015-10-24 ENCOUNTER — Encounter: Payer: Self-pay | Admitting: Podiatry

## 2015-10-24 DIAGNOSIS — M722 Plantar fascial fibromatosis: Secondary | ICD-10-CM | POA: Diagnosis not present

## 2015-10-24 MED ORDER — TRIAMCINOLONE ACETONIDE 10 MG/ML IJ SUSP
10.0000 mg | Freq: Once | INTRAMUSCULAR | Status: AC
Start: 2015-10-24 — End: ?

## 2015-10-26 NOTE — Progress Notes (Signed)
Subjective:     Patient ID: Sissy HoffMichael T Kussman, male   DOB: 07/04/1961, 55 y.o.   MRN: [id number]  HPI patient states I'm still having pain in my right heel more in the arch and also I still have pain when I get up in the morning   Review of Systems     Objective:   Physical Exam Neurovascular status intact muscle strength adequate with continued discomfort in the right plantar fashion at the insertional point tendon into the calcaneus with fluid buildup noted    Assessment:     Plantar fasciitis right with inflammation fluid buildup    Plan:     Reinjected the mid arch area within the tendon complex 3 mg Kenalog 5 mg Xylocaine and I went ahead today and I applied night splint with all instructions on usage for this patient

## 2015-11-14 ENCOUNTER — Ambulatory Visit: Payer: Managed Care, Other (non HMO) | Admitting: Podiatry

## 2015-12-12 ENCOUNTER — Ambulatory Visit (INDEPENDENT_AMBULATORY_CARE_PROVIDER_SITE_OTHER): Payer: Managed Care, Other (non HMO) | Admitting: Podiatry

## 2015-12-12 ENCOUNTER — Encounter: Payer: Self-pay | Admitting: Podiatry

## 2015-12-12 DIAGNOSIS — M722 Plantar fascial fibromatosis: Secondary | ICD-10-CM | POA: Diagnosis not present

## 2015-12-12 MED ORDER — MELOXICAM 15 MG PO TABS: 15.0000 mg | ORAL_TABLET | Freq: Every day | ORAL | Status: AC

## 2015-12-12 MED ORDER — TRIAMCINOLONE ACETONIDE 10 MG/ML IJ SUSP
10.0000 mg | Freq: Once | INTRAMUSCULAR | Status: AC
  Administered 2015-12-12: 10 mg

## 2015-12-14 NOTE — Progress Notes (Signed)
Subjective:     Patient ID: Douglas Wolf, male   DOB: 05/22/1961, 55 y.o.   MRN: [id number]  HPI patient presents stating that he still is having discomfort in his heel but it has improved by about 60%   Review of Systems     Objective:   Physical Exam Neurovascular status intact with continued discomfort in the plantar heel right at the insertional point of the tendon into the calcaneus with fluid buildup with continued night splint usage and fascial brace and orthotic usage    Assessment:     Continuation of plantar fasciitis right    Plan:     Reviewed condition and recommended 1 more cortisone injection along with continued night splint usage fascial brace usage and orthotics. Today I went ahead and injected the right plantar fascia 3 mg Kenalog 5 mg Xylocaine at its insertion into the calcaneus

## 2016-01-30 ENCOUNTER — Ambulatory Visit: Payer: Managed Care, Other (non HMO) | Admitting: Podiatry

## 2016-03-09 ENCOUNTER — Telehealth: Payer: Self-pay | Admitting: *Deleted

## 2016-03-09 MED ORDER — DICLOFENAC SODIUM 75 MG PO TBEC: 75.0000 mg | DELAYED_RELEASE_TABLET | Freq: Two times a day (BID) | ORAL | 0 refills | Status: AC

## 2016-03-09 NOTE — Telephone Encounter (Signed)
Pt left name and phone number. I spoke with pt's mtr, she states his feet are really hurting and the Meloxicam is not helping, and he needs an appt.  Dr. Charlsie Merlesegal ordered Diclofenac 75mg  #30 one tablet bid with food.  I informed pt's mtr and instructed to use lightly fabric covered ice pack 3-4 times a day for 15-20 minutes each session. Pt's mtr states understanding.

## 2016-03-19 ENCOUNTER — Encounter: Payer: Self-pay | Admitting: Podiatry

## 2016-03-19 ENCOUNTER — Ambulatory Visit (INDEPENDENT_AMBULATORY_CARE_PROVIDER_SITE_OTHER): Payer: Managed Care, Other (non HMO) | Admitting: Podiatry

## 2016-03-19 VITALS — BP 116/70 | HR 59 | Resp 16

## 2016-03-19 DIAGNOSIS — M722 Plantar fascial fibromatosis: Secondary | ICD-10-CM | POA: Diagnosis not present

## 2016-03-19 DIAGNOSIS — M79671 Pain in right foot: Secondary | ICD-10-CM

## 2016-03-19 MED ORDER — TRIAMCINOLONE ACETONIDE 10 MG/ML IJ SUSP
10.0000 mg | Freq: Once | INTRAMUSCULAR | Status: AC
  Administered 2016-03-19: 10 mg

## 2016-03-19 NOTE — Progress Notes (Signed)
Subjective:     Patient ID: Douglas Wolf, male   DOB: 01/19/1961, 55 y.o.   MRN: [id number]  HPI patient states she's been doing well but over the last week he started to develop pain in his heel again and he admits she's been going barefoot at home   Review of Systems     Objective:   Physical Exam Neurovascular status intact muscle strength adequate discomfort in the plantar heel right at the insertional point tendon calcaneus with inflammation fluid buildup around the medial band    Assessment:     Continue plantar fasciitis right    Plan:     Careful injection administered 3 mg Kenalog 5 mill grams Xylocaine and discussed that ultimately this may require surgical interventions if symptoms continue to persist despite night splint usage orthotics and not going barefoot

## 2016-05-21 ENCOUNTER — Encounter: Payer: Self-pay | Admitting: Podiatry

## 2016-05-21 ENCOUNTER — Telehealth: Payer: Self-pay | Admitting: *Deleted

## 2016-05-21 ENCOUNTER — Ambulatory Visit (INDEPENDENT_AMBULATORY_CARE_PROVIDER_SITE_OTHER): Payer: Managed Care, Other (non HMO) | Admitting: Podiatry

## 2016-05-21 DIAGNOSIS — B351 Tinea unguium: Secondary | ICD-10-CM | POA: Diagnosis not present

## 2016-05-21 DIAGNOSIS — M722 Plantar fascial fibromatosis: Secondary | ICD-10-CM

## 2016-05-21 NOTE — Progress Notes (Signed)
Subjective:     Patient ID: Douglas Wolf, male   DOB: 09/26/1960, 55 y.o.   MRN: [id number]  HPI patient presents stating my nails need to be cut and my arches are feeling better but I need new orthotics as I'm not able to wear them as much as I like with my shoes   Review of Systems     Objective:   Physical Exam Neurovascular status intact with moderate depression of the arch with fascial pain right that's improving but present with thick yellow brittle nailbeds 1-5 both feet    Assessment:     Mycotic nail infections with tendinitis    Plan:     Reviewed conditions discussed physical therapy and scanned for new orthotics to reduce plantar pressures. Debrided nailbeds 1-5 both feet and reappoint when orthotics are here

## 2016-05-21 NOTE — Telephone Encounter (Signed)
Left message for patient at [phone] (Cell #) to let them know they need to come back into the office to get their feet scanned for orthotics. I also stated in message that he could drop by anytime this week without an appointment and ask for me, Olegario MessierKathy. I will re-scan when he gets here. Pt saw Dr. Charlsie Merlesegal today and we thought [facility]. I called [facility] and they did not have this patient's scan.   I also called patient at their home number [phone] and talked to a lady who answered the phone. I explained to her that I needed to re-scan the patient's feet since [facility] not [facility] in their system still. I told her to have him call me back and that he does not need an appointment. He can just ask for Olegario MessierKathy and I will re-scan his feet when he comes in.  Waiting for a response.

## 2016-05-22 ENCOUNTER — Telehealth: Payer: Self-pay | Admitting: *Deleted

## 2016-05-22 NOTE — Telephone Encounter (Signed)
Patient came back today (05/22/16) at 4:20 pm. I re-scanned feet and inputted information into [facility] to get processed today. Pt will be back in 3 weeks to PUO.

## 2016-05-22 NOTE — Telephone Encounter (Signed)
Returned patient's mother's call Britta Mccreedy(Barbara) at [phone] (Home #) regarding getting her son's feet scanned again today. Pt's mother stated, "Her son will be by today". I told her to have him ask for me, Olegario MessierKathy, and that there would be no co-pay. I told her I would be here until 5 pm. I asked pt's mother when her son got off of work, and she did not give me an answer. I did reiterate to her that I would be leaving at 5 pm and that he needs to get here before 5 pm.  Pt's mother was not overly nice on the phone with me. She was very agitated and annoyed. She blamed us for screwing up the scan. I re-apologized to her for any inconvenience this has caused. [facility] did not have her son's scan from 2014 in their system and that is why we are rescanning patient's feet today.

## 2016-06-11 ENCOUNTER — Ambulatory Visit: Payer: Managed Care, Other (non HMO)

## 2016-06-18 ENCOUNTER — Ambulatory Visit (INDEPENDENT_AMBULATORY_CARE_PROVIDER_SITE_OTHER): Payer: Managed Care, Other (non HMO) | Admitting: Podiatry

## 2016-06-18 DIAGNOSIS — M722 Plantar fascial fibromatosis: Secondary | ICD-10-CM

## 2016-06-18 NOTE — Progress Notes (Signed)
Patient presents for orthotic pick up.  Verbal and written break in and wear instructions given.  Patient will follow up in 4 weeks if symptoms worsen or fail to improve. 

## 2016-06-18 NOTE — Patient Instructions (Signed)

## 2017-11-11 ENCOUNTER — Encounter: Payer: Self-pay | Admitting: Podiatry

## 2017-11-11 ENCOUNTER — Ambulatory Visit: Payer: Managed Care, Other (non HMO)

## 2017-11-11 ENCOUNTER — Ambulatory Visit: Payer: Managed Care, Other (non HMO) | Admitting: Podiatry

## 2017-11-11 DIAGNOSIS — M79672 Pain in left foot: Principal | ICD-10-CM

## 2017-11-11 DIAGNOSIS — M79671 Pain in right foot: Secondary | ICD-10-CM

## 2017-11-11 DIAGNOSIS — M722 Plantar fascial fibromatosis: Secondary | ICD-10-CM

## 2017-11-13 NOTE — Progress Notes (Signed)
Subjective:   Patient ID: Douglas Wolf, male   DOB: 57 y.o.   MRN: [id number]   HPI Patient presents stating he needs new inserts and that his feet can still get sore   ROS      Objective:  Physical Exam  Neurovascular status intact with patient wearing orthotics that done a very good job of controlling his pathology     Assessment:  Chronic plantar fasciitis that is being controlled well with orthotics     Plan:  H&P discussed continued shoe gear modifications physical therapy and patient is casted for new orthotics at this time.  Will be dispensed by ped orthotist

## 2017-12-09 ENCOUNTER — Ambulatory Visit (INDEPENDENT_AMBULATORY_CARE_PROVIDER_SITE_OTHER): Payer: Managed Care, Other (non HMO) | Admitting: Orthotics

## 2017-12-09 DIAGNOSIS — M722 Plantar fascial fibromatosis: Secondary | ICD-10-CM

## 2017-12-09 DIAGNOSIS — M79671 Pain in right foot: Secondary | ICD-10-CM

## 2017-12-09 NOTE — Progress Notes (Signed)
Patient came in today to pick up custom made foot orthotics.  The goals were accomplished and the patient reported no dissatisfaction with said orthotics.  Patient was advised of breakin period and how to report any issues. 

## 2019-05-11 ENCOUNTER — Other Ambulatory Visit: Payer: Self-pay

## 2019-05-11 DIAGNOSIS — Z20822 Contact with and (suspected) exposure to covid-19: Secondary | ICD-10-CM

## 2019-05-12 LAB — NOVEL CORONAVIRUS, NAA: SARS-CoV-2, NAA: NOT DETECTED

## 2019-05-13 ENCOUNTER — Telehealth: Payer: Self-pay | Admitting: Internal Medicine

## 2019-05-13 NOTE — Telephone Encounter (Signed)
Negative COVID results given. Patient results "NOT Detected." Caller expressed understanding. ° °

## 2020-05-11 ENCOUNTER — Ambulatory Visit
Admission: EM | Admit: 2020-05-11 | Discharge: 2020-05-11 | Disposition: A | Discharge status: Home / Self Care | Payer: Managed Care, Other (non HMO) | Attending: Physician Assistant | Admitting: Physician Assistant

## 2020-05-11 ENCOUNTER — Encounter: Payer: Self-pay | Admitting: Emergency Medicine

## 2020-05-11 ENCOUNTER — Other Ambulatory Visit: Payer: Self-pay

## 2020-05-11 DIAGNOSIS — H1032 Unspecified acute conjunctivitis, left eye: Secondary | ICD-10-CM | POA: Diagnosis not present

## 2020-05-11 MED ORDER — OFLOXACIN 0.3 % OP SOLN: 1.0000 [drp] | Freq: Four times a day (QID) | OPHTHALMIC | 0 refills | Status: AC

## 2020-05-11 MED ORDER — SYSTANE 0.4-0.3 % OP GEL: 1.0000 "application " | Freq: Three times a day (TID) | OPHTHALMIC | 0 refills | Status: AC | PRN

## 2020-05-11 NOTE — ED Triage Notes (Signed)
Patient c/o LFT eye redness since yesterday.   Patient states he has a history of Catarax.   Patient denies any pain, itchiness, or drainage.

## 2020-05-11 NOTE — ED Provider Notes (Signed)
[facility]    CSN: [id number] Arrival date & time: 05/11/20  1054      History   Chief Complaint Chief Complaint  Patient presents with  . Eye Problem    HPI Douglas Wolf is a 59 y.o. male.   59 year old male comes in for 2 day history of left eye redness. Denies any pain, itching, drainage. Left eye vision changes. Denies URI symptoms. Denies contact lens, glasses use. H/o cataracts.      Past Medical History:  Diagnosis Date  . Hyperlipidemia   . Hypertension     There are no problems to display for this patient.   History reviewed. No pertinent surgical history.     Home Medications    Prior to Admission medications   Medication Sig Start Date End Date Taking? Authorizing Provider  amLODipine-benazepril (LOTREL) 10-20 MG per capsule Take 1 capsule by mouth daily.   Yes [provider]  hydrochlorothiazide (HYDRODIURIL) 25 MG tablet Take 25 mg by mouth daily.   Yes [provider]  metoprolol tartrate (LOPRESSOR) 25 MG tablet Take 25 mg by mouth 2 (two) times daily.   Yes [provider]  rosuvastatin (CRESTOR) 40 MG tablet Take 40 mg by mouth daily.   Yes [provider]  diclofenac (VOLTAREN) 50 MG EC tablet Take 1 tablet (50 mg total) by mouth 3 (three) times daily. For 5 days, then as needed for pain 09/25/15   Charm Rings, MD  diclofenac (VOLTAREN) 75 MG EC tablet Take 1 tablet (75 mg total) by mouth 2 (two) times daily. 03/09/16   Lenn Sink, DPM  Fenoprofen Calcium 400 MG CAPS Take 1 capsule (400 mg total) by mouth 2 (two) times daily. 03/23/15   Lenn Sink, DPM  meloxicam (MOBIC) 15 MG tablet Take 1 tablet (15 mg total) by mouth daily. 12/12/15   Lenn Sink, DPM  ofloxacin (OCUFLOX) 0.3 % ophthalmic solution Place 1 drop into the left eye 4 (four) times daily for 7 days. 05/11/20 05/18/20  Belinda Fisher, PA-C  Polyethyl Glycol-Propyl Glycol (SYSTANE) 0.4-0.3 % GEL ophthalmic gel Place 1  application into both eyes every 8 (eight) hours as needed. 05/11/20   Belinda Fisher, PA-C    Family History History reviewed. No pertinent family history.  Social History Social History   Tobacco Use  . Smoking status: Never Smoker  . Smokeless tobacco: Never Used  Substance Use Topics  . Alcohol use: No  . Drug use: No     Allergies   Patient has no known allergies.   Review of Systems Review of Systems  Reason unable to perform ROS: See HPI as above.     Physical Exam Triage Vital Signs ED Triage Vitals  Enc Vitals Group     BP 05/11/20 1114 133/79     Pulse Rate 05/11/20 1114 66     Resp 05/11/20 1114 16     Temp 05/11/20 1114 98 F (36.7 C)     Temp Source 05/11/20 1114 Oral     SpO2 05/11/20 1114 97 %     Weight --      Height --      Head Circumference --      Peak Flow --      Pain Score 05/11/20 1112 0     Pain Loc --      Pain Edu? --      Excl. in GC? --    No  data found.  Updated Vital Signs BP 133/79 (BP Location: Left Arm)   Pulse 66   Temp 98 F (36.7 C) (Oral)   Resp 16   SpO2 97%   Visual Acuity Right Eye Distance:   20/70 ([place]) Left Eye Distance:   20/50 ([place]) Bilateral Distance:    Right Eye Near:   Left Eye Near:    Bilateral Near:     Physical Exam Constitutional:      General: He is not in acute distress.    Appearance: He is well-developed.  HENT:     Head: Normocephalic and atraumatic.  Eyes:     General: Lids are normal. Lids are everted, no foreign bodies appreciated.     Extraocular Movements: Extraocular movements intact.     Conjunctiva/sclera:     Right eye: Right conjunctiva is not injected.     Left eye: Left conjunctiva is injected.     Pupils: Pupils are equal, round, and reactive to light.  Musculoskeletal:     Cervical back: Normal range of motion and neck supple.  Skin:    General: Skin is warm and dry.  Neurological:     Mental Status: He is alert and oriented to person, place, and time.      UC  Treatments / Results  Labs (all labs ordered are listed, but only abnormal results are displayed) Labs Reviewed - No data to display  EKG   Radiology No results found.  Procedures Procedures (including critical care time)  Medications Ordered in UC Medications - No data to display  Initial Impression / Assessment and Plan / UC Course  I have reviewed the triage vital signs and the nursing notes.  Pertinent labs & imaging results that were available during my care of the patient were reviewed by me and considered in my medical decision making (see chart for details).    Left eye conjunctival injection. No photophobia, drainage. No pain or itching. Although perceived vision changes to the left eye, states blinking can improve vision. VA 20/70 OD, 20/50 OS. Will trial systane for possible dry eyes causing symptoms. Given unilateral symptoms, ofloxacin also called into pharmacy, if develop drainage, or continued symptoms, can start to cover for bacterial conjunctivitis. Return precautions given. Otherwise recheck with ophthalmology if symptoms not improving.   Final Clinical Impressions(s) / UC Diagnoses   Final diagnoses:  Acute conjunctivitis of left eye, unspecified acute conjunctivitis type    ED Prescriptions    Medication Sig Dispense Auth. Provider   Polyethyl Glycol-Propyl Glycol (SYSTANE) 0.4-0.3 % GEL ophthalmic gel Place 1 application into both eyes every 8 (eight) hours as needed. 8 mL Jaquarius Seder V, PA-C   ofloxacin (OCUFLOX) 0.3 % ophthalmic solution Place 1 drop into the left eye 4 (four) times daily for 7 days. 1.4 mL Belinda Fisher, PA-C     PDMP not reviewed this encounter.   Belinda Fisher, PA-C 05/11/20 1142

## 2020-05-11 NOTE — Discharge Instructions (Signed)
Start systane eye gel 2-3 times a day as needed. Lid scrubs and warm compresses as directed.   If after 2-3 days, symptoms not improving, or develop drainage, you can add ofloxacin as directed for pink eye.  Monitor for any worsening of symptoms, changes in vision, sensitivity to light, eye swelling, painful eye movement, follow up with ophthalmology for further evaluation.

## 2021-04-19 ENCOUNTER — Ambulatory Visit
Admission: EM | Admit: 2021-04-19 | Discharge: 2021-04-19 | Disposition: A | Discharge status: Home / Self Care | Payer: Managed Care, Other (non HMO) | Attending: Internal Medicine | Admitting: Internal Medicine

## 2021-04-19 ENCOUNTER — Other Ambulatory Visit: Payer: Self-pay

## 2021-04-19 DIAGNOSIS — H1032 Unspecified acute conjunctivitis, left eye: Secondary | ICD-10-CM

## 2021-04-19 MED ORDER — ERYTHROMYCIN 5 MG/GM OP OINT: TOPICAL_OINTMENT | OPHTHALMIC | 0 refills | Status: DC

## 2021-04-19 NOTE — ED Provider Notes (Signed)
[facility]    CSN: [id number] Arrival date & time: 04/19/21  1004      History   Chief Complaint Chief Complaint  Patient presents with   Eye Drainage    HPI Douglas Wolf is a 60 y.o. male.   Patient presents with left eye redness and draining that started approximately 2 days ago.  Patient reports that drainage is "watery".  Denies any itchiness or pain to the eye.  Denies any trauma or foreign bodies to the eye.  Denies any blurry vision.  Denies wearing contacts or glasses.  Denies fevers or upper respiratory symptoms.    Past Medical History:  Diagnosis Date   Hyperlipidemia    Hypertension     There are no problems to display for this patient.   History reviewed. No pertinent surgical history.     Home Medications    Prior to Admission medications   Medication Sig Start Date End Date Taking? Authorizing Provider  erythromycin ophthalmic ointment Place a 1/2 inch ribbon of ointment into the lower eyelid 4 times daily for 7 days. 04/19/21  Yes Lance Muss, FNP  amLODipine-benazepril (LOTREL) 10-20 MG per capsule Take 1 capsule by mouth daily.    [provider]  diclofenac (VOLTAREN) 50 MG EC tablet Take 1 tablet (50 mg total) by mouth 3 (three) times daily. For 5 days, then as needed for pain 09/25/15   Charm Rings, MD  diclofenac (VOLTAREN) 75 MG EC tablet Take 1 tablet (75 mg total) by mouth 2 (two) times daily. 03/09/16   Lenn Sink, DPM  Fenoprofen Calcium 400 MG CAPS Take 1 capsule (400 mg total) by mouth 2 (two) times daily. 03/23/15   Lenn Sink, DPM  hydrochlorothiazide (HYDRODIURIL) 25 MG tablet Take 25 mg by mouth daily.    [provider]  meloxicam (MOBIC) 15 MG tablet Take 1 tablet (15 mg total) by mouth daily. 12/12/15   Lenn Sink, DPM  metoprolol tartrate (LOPRESSOR) 25 MG tablet Take 25 mg by mouth 2 (two) times daily.    [provider]  Polyethyl Glycol-Propyl Glycol (SYSTANE) 0.4-0.3  % GEL ophthalmic gel Place 1 application into both eyes every 8 (eight) hours as needed. 05/11/20   Cathie Hoops, Amy V, PA-C  rosuvastatin (CRESTOR) 40 MG tablet Take 40 mg by mouth daily.    [provider]    Family History History reviewed. No pertinent family history.  Social History Social History   Tobacco Use   Smoking status: Never   Smokeless tobacco: Never  Substance Use Topics   Alcohol use: No   Drug use: No     Allergies   Patient has no known allergies.   Review of Systems Review of Systems Per HPI  Physical Exam Triage Vital Signs ED Triage Vitals  Enc Vitals Group     BP 04/19/21 1045 137/87     Pulse Rate 04/19/21 1045 70     Resp 04/19/21 1045 18     Temp 04/19/21 1045 98.4 F (36.9 C)     Temp Source 04/19/21 1045 Oral     SpO2 04/19/21 1045 97 %     Weight --      Height --      Head Circumference --      Peak Flow --      Pain Score 04/19/21 1046 0     Pain Loc --      Pain Edu? --  Excl. in GC? --    No data found.  Updated Vital Signs BP 137/87   Pulse 70   Temp 98.4 F (36.9 C) (Oral)   Resp 18   SpO2 97%   Visual Acuity Right Eye Distance: 20/25 Left Eye Distance: 20/40 Bilateral Distance: 20/30  Right Eye Near:   Left Eye Near:    Bilateral Near:     Physical Exam Constitutional:      General: He is not in acute distress.    Appearance: Normal appearance. He is not toxic-appearing or diaphoretic.  HENT:     Head: Normocephalic and atraumatic.  Eyes:     General: Lids are normal. Lids are everted, no foreign bodies appreciated. Vision grossly intact. Gaze aligned appropriately.        Left eye: No foreign body, discharge or hordeolum.     Extraocular Movements: Extraocular movements intact.     Conjunctiva/sclera:     Left eye: Left conjunctiva is injected. No chemosis, exudate or hemorrhage. Pulmonary:     Effort: Pulmonary effort is normal.  Neurological:     General: No focal deficit present.     Mental  Status: He is alert and oriented to person, place, and time. Mental status is at baseline.  Psychiatric:        Mood and Affect: Mood normal.        Behavior: Behavior normal.        Thought Content: Thought content normal.        Judgment: Judgment normal.     UC Treatments / Results  Labs (all labs ordered are listed, but only abnormal results are displayed) Labs Reviewed - No data to display  EKG   Radiology No results found.  Procedures Procedures (including critical care time)  Medications Ordered in UC Medications - No data to display  Initial Impression / Assessment and Plan / UC Course  I have reviewed the triage vital signs and the nursing notes.  Pertinent labs & imaging results that were available during my care of the patient were reviewed by me and considered in my medical decision making (see chart for details).     It appears the patient has left bacterial conjunctivitis on physical exam.  Will treat with erythromycin ointment x7 days.  Visual acuity fairly normal in urgent care today.  No red flags on exam.Discussed strict return precautions. Patient verbalized understanding and is agreeable with plan.  Final Clinical Impressions(s) / UC Diagnoses   Final diagnoses:  Acute bacterial conjunctivitis of left eye     Discharge Instructions      It appears that you have pinkeye.  You have been prescribed antibiotic ointment to help with this.  Please follow-up if symptoms persist or worsen.     ED Prescriptions     Medication Sig Dispense Auth. Provider   erythromycin ophthalmic ointment Place a 1/2 inch ribbon of ointment into the lower eyelid 4 times daily for 7 days. 3.5 g Lance Muss, FNP      PDMP not reviewed this encounter.   Lance Muss, FNP 04/19/21 1056

## 2021-04-19 NOTE — Discharge Instructions (Addendum)
It appears that you have pinkeye.  You have been prescribed antibiotic ointment to help with this.  Please follow-up if symptoms persist or worsen.

## 2021-04-19 NOTE — ED Triage Notes (Signed)
Pt states woke up yesterday with lt eye redness and drainage. Denies injury or pain.

## 2022-02-24 ENCOUNTER — Ambulatory Visit
Admission: EM | Admit: 2022-02-24 | Discharge: 2022-02-24 | Disposition: A | Discharge status: Home / Self Care | Payer: Managed Care, Other (non HMO) | Attending: Physician Assistant | Admitting: Physician Assistant

## 2022-02-24 DIAGNOSIS — H109 Unspecified conjunctivitis: Secondary | ICD-10-CM | POA: Diagnosis not present

## 2022-02-24 MED ORDER — ERYTHROMYCIN 5 MG/GM OP OINT: TOPICAL_OINTMENT | OPHTHALMIC | 0 refills | Status: AC

## 2022-02-24 NOTE — ED Provider Notes (Signed)
[facility]    CSN: [id number] Arrival date & time: 02/24/22  0934      History   Chief Complaint No chief complaint on file.   HPI Douglas Wolf is a 61 y.o. male.   Pt complains of right eye redness and discharge that started about one week ago.  Denies visual changes, photophobia, eye pain.  He does not wear contacts or glasses.  He has tried nothing for the sx.     Past Medical History:  Diagnosis Date   Hyperlipidemia    Hypertension     There are no problems to display for this patient.   History reviewed. No pertinent surgical history.     Home Medications    Prior to Admission medications   Medication Sig Start Date End Date Taking? Authorizing Provider  amLODipine-benazepril (LOTREL) 10-20 MG per capsule Take 1 capsule by mouth daily.    [provider]  diclofenac (VOLTAREN) 50 MG EC tablet Take 1 tablet (50 mg total) by mouth 3 (three) times daily. For 5 days, then as needed for pain 09/25/15   Charm Rings, MD  diclofenac (VOLTAREN) 75 MG EC tablet Take 1 tablet (75 mg total) by mouth 2 (two) times daily. 03/09/16   Lenn Sink, DPM  erythromycin ophthalmic ointment Place a 1/2 inch ribbon of ointment into the lower eyelid 4 times daily for 7 days. 02/24/22   Ward, Tylene Fantasia, PA-C  Fenoprofen Calcium 400 MG CAPS Take 1 capsule (400 mg total) by mouth 2 (two) times daily. 03/23/15   Lenn Sink, DPM  hydrochlorothiazide (HYDRODIURIL) 25 MG tablet Take 25 mg by mouth daily.    [provider]  meloxicam (MOBIC) 15 MG tablet Take 1 tablet (15 mg total) by mouth daily. 12/12/15   Lenn Sink, DPM  metoprolol tartrate (LOPRESSOR) 25 MG tablet Take 25 mg by mouth 2 (two) times daily.    [provider]  Polyethyl Glycol-Propyl Glycol (SYSTANE) 0.4-0.3 % GEL ophthalmic gel Place 1 application into both eyes every 8 (eight) hours as needed. 05/11/20   Cathie Hoops, Amy V, PA-C  rosuvastatin (CRESTOR) 40 MG tablet Take 40 mg by  mouth daily.    [provider]    Family History Family History  Family history unknown: Yes    Social History Social History   Tobacco Use   Smoking status: Never   Smokeless tobacco: Never  Substance Use Topics   Alcohol use: No   Drug use: No     Allergies   Patient has no known allergies.   Review of Systems Review of Systems  Constitutional:  Negative for chills and fever.  HENT:  Negative for ear pain and sore throat.   Eyes:  Positive for discharge and redness. Negative for pain and visual disturbance.  Respiratory:  Negative for cough and shortness of breath.   Cardiovascular:  Negative for chest pain and palpitations.  Gastrointestinal:  Negative for abdominal pain and vomiting.  Genitourinary:  Negative for dysuria and hematuria.  Musculoskeletal:  Negative for arthralgias and back pain.  Skin:  Negative for color change and rash.  Neurological:  Negative for seizures and syncope.  All other systems reviewed and are negative.    Physical Exam Triage Vital Signs ED Triage Vitals  Enc Vitals Group     BP 02/24/22 1040 121/78     Pulse Rate 02/24/22 1040 67     Resp 02/24/22 1040 18     Temp  02/24/22 1040 97.8 F (36.6 C)     Temp Source 02/24/22 1040 Oral     SpO2 02/24/22 1040 97 %     Weight --      Height --      Head Circumference --      Peak Flow --      Pain Score 02/24/22 1041 6     Pain Loc --      Pain Edu? --      Excl. in GC? --    No data found.  Updated Vital Signs BP 121/78 (BP Location: Left Arm)   Pulse 67   Temp 97.8 F (36.6 C) (Oral)   Resp 18   SpO2 97%   Visual Acuity Right Eye Distance:   Left Eye Distance:   Bilateral Distance:    Right Eye Near:   Left Eye Near:    Bilateral Near:     Physical Exam Vitals and nursing note reviewed.  Constitutional:      General: He is not in acute distress.    Appearance: He is well-developed.  HENT:     Head: Normocephalic and atraumatic.  Eyes:      General:        Right eye: Discharge present.     Conjunctiva/sclera:     Right eye: Right conjunctiva is injected.  Cardiovascular:     Rate and Rhythm: Normal rate and regular rhythm.     Heart sounds: No murmur heard. Pulmonary:     Effort: Pulmonary effort is normal. No respiratory distress.     Breath sounds: Normal breath sounds.  Abdominal:     Palpations: Abdomen is soft.     Tenderness: There is no abdominal tenderness.  Musculoskeletal:        General: No swelling.     Cervical back: Neck supple.  Skin:    General: Skin is warm and dry.     Capillary Refill: Capillary refill takes less than 2 seconds.  Neurological:     Mental Status: He is alert.  Psychiatric:        Mood and Affect: Mood normal.      UC Treatments / Results  Labs (all labs ordered are listed, but only abnormal results are displayed) Labs Reviewed - No data to display  EKG   Radiology No results found.  Procedures Procedures (including critical care time)  Medications Ordered in UC Medications - No data to display  Initial Impression / Assessment and Plan / UC Course  I have reviewed the triage vital signs and the nursing notes.  Pertinent labs & imaging results that were available during my care of the patient were reviewed by me and considered in my medical decision making (see chart for details).     Bacterial conjunctivitis, no visual disturbances.  Antibiotic prescribed. Supportive care discussed.  Return precautions discussed.  Final Clinical Impressions(s) / UC Diagnoses   Final diagnoses:  Bacterial conjunctivitis of right eye     Discharge Instructions      Use eye ointment as prescribed Can apply cold compress for comfort Return if no improvement or symptoms become worse   ED Prescriptions     Medication Sig Dispense Auth. Provider   erythromycin ophthalmic ointment Place a 1/2 inch ribbon of ointment into the lower eyelid 4 times daily for 7 days. 3.5 g Ward,  Tylene Fantasia, PA-C      PDMP not reviewed this encounter.   Ward, Tylene Fantasia, PA-C 02/24/22 1113

## 2022-02-24 NOTE — ED Triage Notes (Addendum)
Pt presents with right eye pain for over a week with no known injury or trauma.

## 2022-02-24 NOTE — Discharge Instructions (Signed)
Use eye ointment as prescribed Can apply cold compress for comfort Return if no improvement or symptoms become worse

## 2022-03-03 ENCOUNTER — Ambulatory Visit
Admission: EM | Admit: 2022-03-03 | Discharge: 2022-03-03 | Disposition: A | Discharge status: Home / Self Care | Payer: Managed Care, Other (non HMO) | Attending: Physician Assistant | Admitting: Physician Assistant

## 2022-03-03 ENCOUNTER — Telehealth: Payer: Self-pay

## 2022-03-03 ENCOUNTER — Encounter: Payer: Self-pay | Admitting: Emergency Medicine

## 2022-03-03 DIAGNOSIS — H1031 Unspecified acute conjunctivitis, right eye: Secondary | ICD-10-CM

## 2022-03-03 DIAGNOSIS — H5789 Other specified disorders of eye and adnexa: Secondary | ICD-10-CM | POA: Diagnosis not present

## 2022-03-03 MED ORDER — OFLOXACIN 0.3 % OP SOLN: 1.0000 [drp] | Freq: Four times a day (QID) | OPHTHALMIC | 0 refills | Status: DC

## 2022-03-03 NOTE — ED Triage Notes (Signed)
Pt is present today with right eye redness. Pt states that he was treated last week for conjunctivitis but the rx did not help.   Pt states that he also has a cough and nasal congestion that started Monday.,

## 2022-03-03 NOTE — ED Provider Notes (Signed)
Douglas Wolf    CSN: [id number] Arrival date & time: 03/03/22  0803      History   Chief Complaint Chief Complaint  Patient presents with   Eye Problem    HPI Douglas Wolf is a 61 y.o. male.   Patient presents today with a several week history of redness of his right eye.  He was seen by our clinic approximately week ago at which point he was diagnosed with bacterial conjunctivitis and started on erythromycin.  He reports taking medication as prescribed without missing doses but has not had any improvement.  He denies any ocular pain, visual disturbance, headache, fever, nausea, vomiting.  Reports that he has developed some URI symptoms including cough and congestion with this began after his eye was already read.  He denies any exposure to fine particulate matter or chemicals.  He does not wear contacts.  He is not using any over-the-counter medication for symptom management.  He does not have an ophthalmologist.    Past Medical History:  Diagnosis Date   Hyperlipidemia    Hypertension     There are no problems to display for this patient.   History reviewed. No pertinent surgical history.     Home Medications    Prior to Admission medications   Medication Sig Start Date End Date Taking? Authorizing Provider  ofloxacin (OCUFLOX) 0.3 % ophthalmic solution Place 1 drop into the right eye 4 (four) times daily. 03/03/22  Yes Adonai Helzer K, PA-C  amLODipine-benazepril (LOTREL) 10-20 MG per capsule Take 1 capsule by mouth daily.    [provider]  diclofenac (VOLTAREN) 50 MG EC tablet Take 1 tablet (50 mg total) by mouth 3 (three) times daily. For 5 days, then as needed for pain 09/25/15   Charm Rings, MD  diclofenac (VOLTAREN) 75 MG EC tablet Take 1 tablet (75 mg total) by mouth 2 (two) times daily. 03/09/16   Lenn Sink, DPM  erythromycin ophthalmic ointment Place a 1/2 inch ribbon of ointment into the lower eyelid 4 times daily for 7 days.  02/24/22   Ward, Tylene Fantasia, PA-C  Fenoprofen Calcium 400 MG CAPS Take 1 capsule (400 mg total) by mouth 2 (two) times daily. 03/23/15   Lenn Sink, DPM  hydrochlorothiazide (HYDRODIURIL) 25 MG tablet Take 25 mg by mouth daily.    [provider]  meloxicam (MOBIC) 15 MG tablet Take 1 tablet (15 mg total) by mouth daily. 12/12/15   Lenn Sink, DPM  metoprolol tartrate (LOPRESSOR) 25 MG tablet Take 25 mg by mouth 2 (two) times daily.    [provider]  Polyethyl Glycol-Propyl Glycol (SYSTANE) 0.4-0.3 % GEL ophthalmic gel Place 1 application into both eyes every 8 (eight) hours as needed. 05/11/20   Cathie Hoops, Amy V, PA-C  rosuvastatin (CRESTOR) 40 MG tablet Take 40 mg by mouth daily.    [provider]    Family History Family History  Family history unknown: Yes    Social History Social History   Tobacco Use   Smoking status: Never   Smokeless tobacco: Never  Substance Use Topics   Alcohol use: No   Drug use: No     Allergies   Patient has no known allergies.   Review of Systems Review of Systems  Constitutional:  Negative for activity change, appetite change, fatigue and fever.  HENT:  Positive for congestion. Negative for sinus pressure, sneezing and sore throat.   Eyes:  Positive for redness. Negative  for photophobia, pain, discharge, itching and visual disturbance.  Respiratory:  Positive for cough. Negative for shortness of breath.   Cardiovascular:  Negative for chest pain.  Gastrointestinal:  Negative for abdominal pain, diarrhea, nausea and vomiting.  Neurological:  Negative for dizziness, light-headedness and headaches.     Physical Exam Triage Vital Signs ED Triage Vitals [03/03/22 0819]  Enc Vitals Group     BP 128/79     Pulse Rate 76     Resp 18     Temp 98.6 F (37 C)     Temp src      SpO2 97 %     Weight      Height      Head Circumference      Peak Flow      Pain Score 0     Pain Loc      Pain Edu?      Excl. in GC?     No data found.  Updated Vital Signs BP 128/79   Pulse 76   Temp 98.6 F (37 C)   Resp 18   SpO2 97%   Visual Acuity Right Eye Distance: 20/50 Left Eye Distance: 20/40 Bilateral Distance: 20/50  Right Eye Near:   Left Eye Near:    Bilateral Near:     Physical Exam Vitals reviewed.  Constitutional:      General: He is awake.     Appearance: Normal appearance. He is well-developed. He is not ill-appearing.     Comments: Very pleasant male appears stated age in no acute distress sitting comfortably in exam room  HENT:     Head: Normocephalic and atraumatic.     Right Ear: Tympanic membrane, ear canal and external ear normal. Tympanic membrane is not erythematous or bulging.     Left Ear: Tympanic membrane, ear canal and external ear normal. Tympanic membrane is not erythematous or bulging.     Nose: Nose normal.     Mouth/Throat:     Pharynx: Uvula midline. No oropharyngeal exudate or posterior oropharyngeal erythema.  Eyes:     Extraocular Movements: Extraocular movements intact.     Conjunctiva/sclera:     Right eye: Right conjunctiva is injected. Chemosis present.     Left eye: Left conjunctiva is not injected. No chemosis.    Pupils: Pupils are equal, round, and reactive to light.     Right eye: No corneal abrasion or fluorescein uptake. Seidel exam negative.  Cardiovascular:     Rate and Rhythm: Normal rate and regular rhythm.     Heart sounds: Normal heart sounds, S1 normal and S2 normal. No murmur heard. Pulmonary:     Effort: Pulmonary effort is normal. No accessory muscle usage or respiratory distress.     Breath sounds: Normal breath sounds. No stridor. No wheezing, rhonchi or rales.     Comments: Clear to auscultation bilaterally Neurological:     Mental Status: He is alert.  Psychiatric:        Behavior: Behavior is cooperative.      UC Treatments / Results  Labs (all labs ordered are listed, but only abnormal results are displayed) Labs Reviewed  - No data to display  EKG   Radiology No results found.  Procedures Procedures (including critical Wolf time)  Medications Ordered in UC Medications - No data to display  Initial Impression / Assessment and Plan / UC Course  I have reviewed the triage vital signs and the nursing notes.  Pertinent labs & imaging  results that were available during my Wolf of the patient were reviewed by me and considered in my medical decision making (see chart for details).     Fluorescein stain was normal in clinic today.  Symptoms consistent with bacterial conjunctivitis though unclear why this is not responding to medication.  We will switch to for quinolone for greater coverage.  Low suspicion for uveitis or other inflammatory eye disease as patient denies any photophobia, ocular pain, visual disturbance.  We did discuss that given his persistent symptoms including recurrent episodes of bacterial conjunctivitis he needs to follow-up with an ophthalmologist.  He was given contact information for provider on-call with instruction to call to schedule appointment first thing next week.  Discussed that if at any point he has worsening symptoms including ocular pain, visual disturbance, photosensitivity, nausea, vomiting he is to go to the emergency room immediately to which he expressed understanding.  Strict return precautions given.  Work excuse note provided.  Final Clinical Impressions(s) / UC Diagnoses   Final diagnoses:  Acute bacterial conjunctivitis of right eye  Redness of right eye     Discharge Instructions      Use ofloxacin drops 4 times daily for the next week.  Given you continue to have symptoms it is very important that you follow-up with an eye specialist.  Please call to schedule an appointment with them first thing Monday morning.  If you have any worsening symptoms including pain, vision change, sensitivity to light, headache, fever, nausea, vomiting you must go to the emergency  room immediately.     ED Prescriptions     Medication Sig Dispense Auth. Provider   ofloxacin (OCUFLOX) 0.3 % ophthalmic solution Place 1 drop into the right eye 4 (four) times daily. 5 mL Toris Laverdiere K, PA-C      PDMP not reviewed this encounter.   Jeani Hawking, PA-C 03/03/22 [phone]

## 2022-03-03 NOTE — Discharge Instructions (Addendum)
Use ofloxacin drops 4 times daily for the next week.  Given you continue to have symptoms it is very important that you follow-up with an eye specialist.  Please call to schedule an appointment with them first thing Monday morning.  If you have any worsening symptoms including pain, vision change, sensitivity to light, headache, fever, nausea, vomiting you must go to the emergency room immediately.

## 2022-07-15 ENCOUNTER — Ambulatory Visit
Admission: EM | Admit: 2022-07-15 | Discharge: 2022-07-15 | Disposition: A | Discharge status: Home / Self Care | Payer: Managed Care, Other (non HMO) | Attending: Urgent Care | Admitting: Urgent Care

## 2022-07-15 ENCOUNTER — Telehealth: Payer: Self-pay

## 2022-07-15 ENCOUNTER — Ambulatory Visit (HOSPITAL_COMMUNITY)
Admission: RE | Admit: 2022-07-15 | Discharge: 2022-07-15 | Disposition: A | Discharge status: Home / Self Care | Payer: Managed Care, Other (non HMO) | Source: Ambulatory Visit | Attending: Internal Medicine | Admitting: Internal Medicine

## 2022-07-15 DIAGNOSIS — H109 Unspecified conjunctivitis: Secondary | ICD-10-CM | POA: Diagnosis not present

## 2022-07-15 MED ORDER — TOBRAMYCIN 0.3 % OP SOLN: 1.0000 [drp] | OPHTHALMIC | 0 refills | Status: AC

## 2022-07-15 MED ORDER — TOBRAMYCIN 0.3 % OP SOLN: 1.0000 [drp] | OPHTHALMIC | 0 refills | Status: DC

## 2022-07-15 NOTE — ED Triage Notes (Signed)
Pt c/o redness, watery to left eye started yesterday-NAD-steady gait

## 2022-07-15 NOTE — ED Provider Notes (Signed)
[facility]  Note:  This document was prepared using Conservation officer, historic buildings and may include unintentional dictation errors.  MRN: [id number] DOB: 20-May-1961  Subjective:   Douglas Wolf is a 62 y.o. male presenting for 1 day history of persistent left eye redness and watery eyes.  Patient reports that he was rubbing his eye frequently yesterday.  States that it felt good but was not bothering him.  No contact lens use.  Reports that he gets regular eye checks, last dilated eye exam was about 4 months ago and reports that everything was normal.  No history of glaucoma.  No vision changes, eye trauma, contact lens use.   Current Facility-Administered Medications:    triamcinolone acetonide (KENALOG) 10 MG/ML injection 10 mg, 10 mg, Other, Once, Regal, Kirstie Peri, DPM  Current Outpatient Medications:    amLODipine-benazepril (LOTREL) 10-20 MG per capsule, Take 1 capsule by mouth daily., Disp: , Rfl:    diclofenac (VOLTAREN) 50 MG EC tablet, Take 1 tablet (50 mg total) by mouth 3 (three) times daily. For 5 days, then as needed for pain, Disp: 30 tablet, Rfl: 0   diclofenac (VOLTAREN) 75 MG EC tablet, Take 1 tablet (75 mg total) by mouth 2 (two) times daily., Disp: 30 tablet, Rfl: 0   erythromycin ophthalmic ointment, Place a 1/2 inch ribbon of ointment into the lower eyelid 4 times daily for 7 days., Disp: 3.5 g, Rfl: 0   Fenoprofen Calcium 400 MG CAPS, Take 1 capsule (400 mg total) by mouth 2 (two) times daily., Disp: 80 capsule, Rfl: 0   hydrochlorothiazide (HYDRODIURIL) 25 MG tablet, Take 25 mg by mouth daily., Disp: , Rfl:    meloxicam (MOBIC) 15 MG tablet, Take 1 tablet (15 mg total) by mouth daily., Disp: 30 tablet, Rfl: 2   metoprolol tartrate (LOPRESSOR) 25 MG tablet, Take 25 mg by mouth 2 (two) times daily., Disp: , Rfl:    ofloxacin (OCUFLOX) 0.3 % ophthalmic solution, Place 1 drop into the right eye 4 (four) times daily., Disp: 5 mL, Rfl: 0    Polyethyl Glycol-Propyl Glycol (SYSTANE) 0.4-0.3 % GEL ophthalmic gel, Place 1 application into both eyes every 8 (eight) hours as needed., Disp: 8 mL, Rfl: 0   rosuvastatin (CRESTOR) 40 MG tablet, Take 40 mg by mouth daily., Disp: , Rfl:    No Known Allergies  Past Medical History:  Diagnosis Date   Hyperlipidemia    Hypertension      History reviewed. No pertinent surgical history.  Family History  Family history unknown: Yes    Social History   Tobacco Use   Smoking status: Never   Smokeless tobacco: Never  Vaping Use   Vaping Use: Never used  Substance Use Topics   Alcohol use: No   Drug use: No    ROS   Objective:   Vitals: BP 130/76 (BP Location: Right Arm)   Pulse 68   Temp 98 F (36.7 C) (Oral)   Resp 18   SpO2 94%   Physical Exam Constitutional:      General: He is not in acute distress.    Appearance: Normal appearance. He is well-developed and normal weight. He is not ill-appearing, toxic-appearing or diaphoretic.  HENT:     Head: Normocephalic and atraumatic.     Right Ear: External ear normal.     Left Ear: External ear normal.     Nose: Nose normal.     Mouth/Throat:     Pharynx:  Oropharynx is clear.  Eyes:     General: Lids are everted, no foreign bodies appreciated. No scleral icterus.       Right eye: No foreign body, discharge or hordeolum.        Left eye: No foreign body, discharge or hordeolum.     Extraocular Movements: Extraocular movements intact.     Conjunctiva/sclera:     Right eye: Right conjunctiva is not injected. No chemosis, exudate or hemorrhage.    Left eye: Left conjunctiva is injected. No chemosis, exudate or hemorrhage. Cardiovascular:     Rate and Rhythm: Normal rate.  Pulmonary:     Effort: Pulmonary effort is normal.  Musculoskeletal:     Cervical back: Normal range of motion.  Neurological:     Mental Status: He is alert and oriented to person, place, and time.  Psychiatric:        Mood and Affect: Mood  normal.        Behavior: Behavior normal.        Thought Content: Thought content normal.        Judgment: Judgment normal.     Assessment and Plan :   PDMP not reviewed this encounter.  1. Bacterial conjunctivitis of left eye     I advised patient to stop using the leftover vancomycin that he had before.  It is advised that last time either. Will start tobramycin to address bacterial conjunctivitis of the left eye.  Provided him with information to Dr. Katy Fitch as he would like to start seeing a new eye doctor.  Counseled patient on potential for adverse effects with medications prescribed/recommended today, ER and return-to-clinic precautions discussed, patient verbalized understanding.    Jaynee Eagles, [place] 07/15/22 1257

## 2023-04-08 ENCOUNTER — Ambulatory Visit: Payer: Managed Care, Other (non HMO) | Admitting: Podiatry

## 2023-04-08 ENCOUNTER — Encounter: Payer: Self-pay | Admitting: Podiatry

## 2023-04-08 VITALS — BP 124/74 | HR 66

## 2023-04-08 DIAGNOSIS — M79675 Pain in left toe(s): Secondary | ICD-10-CM

## 2023-04-08 DIAGNOSIS — M79674 Pain in right toe(s): Secondary | ICD-10-CM

## 2023-04-08 DIAGNOSIS — B351 Tinea unguium: Secondary | ICD-10-CM

## 2023-04-08 NOTE — Progress Notes (Signed)
Subjective:   Patient ID: Douglas Wolf, male   DOB: 62 y.o.   MRN: [id number]   HPI Patient presents stating he has had a lot of thickness and discomfort in his toenails 1-5 both feet and he cannot take care of them himself.  He has tried Vicks and he has tried wider shoes without relief does not smoke likes to be active   Review of Systems  All other systems reviewed and are negative.       Objective:  Physical Exam Vitals and nursing note reviewed.  Constitutional:      Appearance: He is well-developed.  Pulmonary:     Effort: Pulmonary effort is normal.  Musculoskeletal:        General: Normal range of motion.  Skin:    General: Skin is warm.  Neurological:     Mental Status: He is alert.     Neurovascular status intact muscle strength adequate range of motion adequate severely thickened dystrophic nailbeds 1 through 5 both feet that can be painful and are very damaged at this time.  Good digital perfusion well-oriented     Assessment:  Chronic mycotic nail infection with pain 1-5 both feet     Plan:  Debridement nailbeds 1-5 both feet no angiogenic bleeding reappoint routine care discussed medicines but do not recommend currently with H&P done today
